# Patient Record
Sex: Male | Born: 1960 | Race: White | Hispanic: No | Marital: Married | Smoking: Never smoker
Health system: Southern US, Community
[De-identification: ages and names within clinical notes are randomized; demographics above are authoritative.]

## PROBLEM LIST (undated history)

## (undated) DIAGNOSIS — K219 Gastro-esophageal reflux disease without esophagitis: Secondary | ICD-10-CM

## (undated) DIAGNOSIS — F419 Anxiety disorder, unspecified: Secondary | ICD-10-CM

## (undated) HISTORY — PX: CHOLECYSTECTOMY: SHX55

---

## 1998-09-07 ENCOUNTER — Encounter: Payer: Self-pay | Admitting: Family Medicine

## 1998-09-07 ENCOUNTER — Ambulatory Visit (HOSPITAL_COMMUNITY): Admission: RE | Admit: 1998-09-07 | Discharge: 1998-09-07 | Payer: Self-pay | Admitting: Family Medicine

## 1998-10-04 ENCOUNTER — Inpatient Hospital Stay (HOSPITAL_COMMUNITY): Admission: AD | Admit: 1998-10-04 | Discharge: 1998-10-10 | Payer: Self-pay | Admitting: General Surgery

## 1998-10-04 ENCOUNTER — Encounter: Payer: Self-pay | Admitting: General Surgery

## 2005-10-04 ENCOUNTER — Ambulatory Visit (HOSPITAL_COMMUNITY): Admission: RE | Admit: 2005-10-04 | Discharge: 2005-10-04 | Payer: Self-pay | Admitting: Family Medicine

## 2007-01-19 IMAGING — DX DG ORTHOPANTOGRAM /PANORAMIC
1 series · 1 of 1 positions shown · non-contrast
Comparison: None.

CLINICAL DATA: Jaw pain for several weeks.
 ORTHOPANTOGRAM ? 1 VIEW:

[view not recorded]
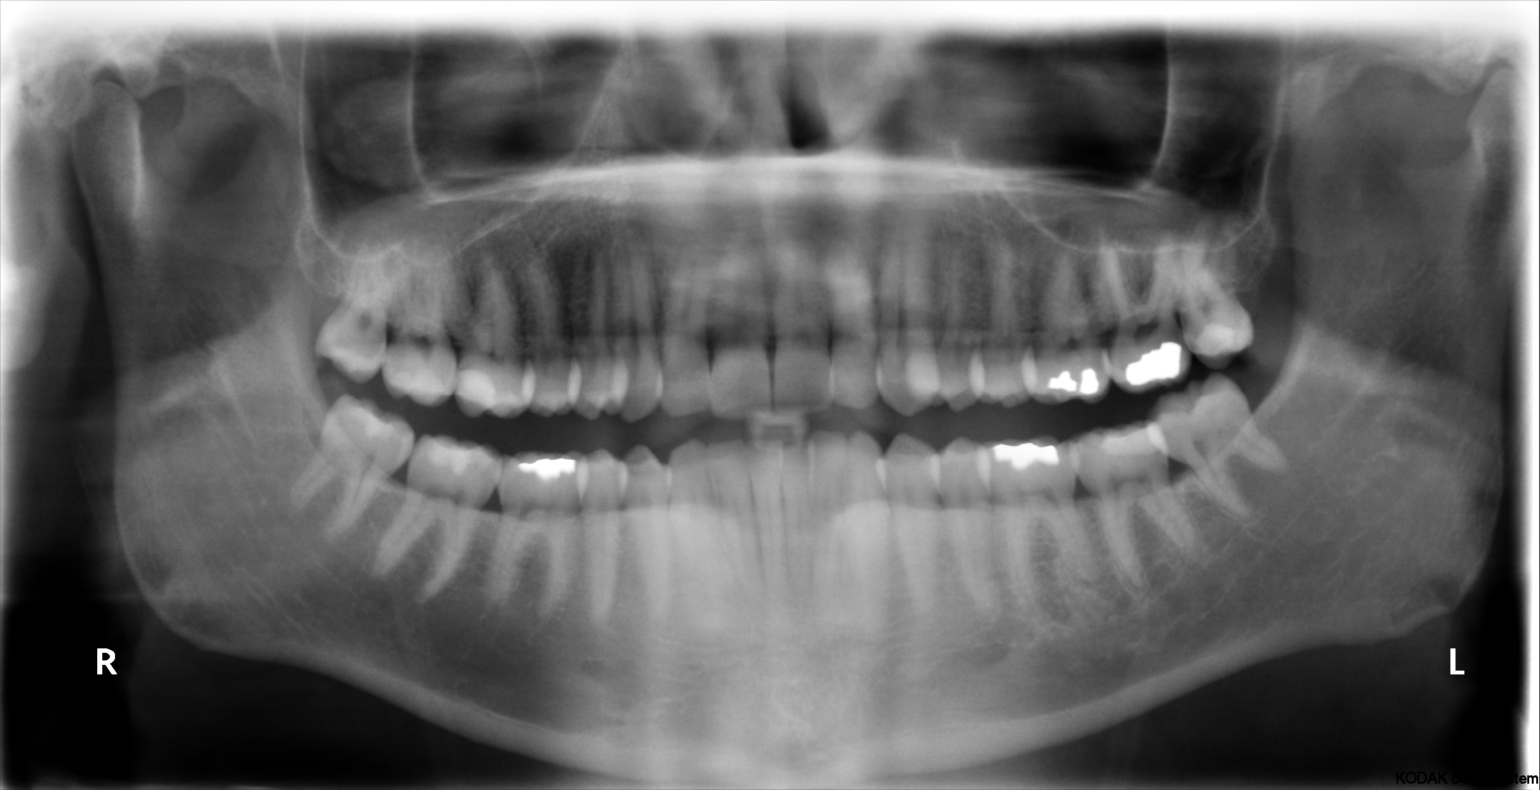

[1 of 1 positions shown; findings below may reference images not displayed]

FINDINGS: No evidence of periapical abscess.  Mandibular condyles are located.
IMPRESSION: No evidence of periapical abscess.

## 2021-09-19 ENCOUNTER — Inpatient Hospital Stay (HOSPITAL_COMMUNITY)
Admission: EM | Admit: 2021-09-19 | Discharge: 2021-09-26 | DRG: 086 | Disposition: A | Discharge status: Home / Self Care | Payer: Managed Care, Other (non HMO) | Attending: Neurosurgery | Admitting: Neurosurgery

## 2021-09-19 ENCOUNTER — Emergency Department (HOSPITAL_COMMUNITY): Payer: Managed Care, Other (non HMO)

## 2021-09-19 ENCOUNTER — Other Ambulatory Visit: Payer: Self-pay

## 2021-09-19 ENCOUNTER — Encounter (HOSPITAL_COMMUNITY): Payer: Self-pay

## 2021-09-19 DIAGNOSIS — T1490XA Injury, unspecified, initial encounter: Principal | ICD-10-CM

## 2021-09-19 DIAGNOSIS — S069XAA Unspecified intracranial injury with loss of consciousness status unknown, initial encounter: Secondary | ICD-10-CM | POA: Diagnosis present

## 2021-09-19 DIAGNOSIS — S0211GA Other fracture of occiput, right side, initial encounter for closed fracture: Secondary | ICD-10-CM | POA: Diagnosis present

## 2021-09-19 DIAGNOSIS — F419 Anxiety disorder, unspecified: Secondary | ICD-10-CM | POA: Diagnosis present

## 2021-09-19 DIAGNOSIS — R40241 Glasgow coma scale score 13-15, unspecified time: Secondary | ICD-10-CM | POA: Diagnosis present

## 2021-09-19 DIAGNOSIS — S32048A Other fracture of fourth lumbar vertebra, initial encounter for closed fracture: Secondary | ICD-10-CM | POA: Diagnosis present

## 2021-09-19 DIAGNOSIS — Y92018 Other place in single-family (private) house as the place of occurrence of the external cause: Secondary | ICD-10-CM

## 2021-09-19 DIAGNOSIS — S066X1A Traumatic subarachnoid hemorrhage with loss of consciousness of 30 minutes or less, initial encounter: Principal | ICD-10-CM | POA: Diagnosis present

## 2021-09-19 DIAGNOSIS — M25511 Pain in right shoulder: Secondary | ICD-10-CM | POA: Diagnosis present

## 2021-09-19 DIAGNOSIS — Z881 Allergy status to other antibiotic agents status: Secondary | ICD-10-CM | POA: Diagnosis not present

## 2021-09-19 DIAGNOSIS — S32018A Other fracture of first lumbar vertebra, initial encounter for closed fracture: Secondary | ICD-10-CM | POA: Diagnosis present

## 2021-09-19 DIAGNOSIS — K219 Gastro-esophageal reflux disease without esophagitis: Secondary | ICD-10-CM | POA: Diagnosis present

## 2021-09-19 DIAGNOSIS — W1789XA Other fall from one level to another, initial encounter: Secondary | ICD-10-CM | POA: Diagnosis present

## 2021-09-19 DIAGNOSIS — S40811A Abrasion of right upper arm, initial encounter: Secondary | ICD-10-CM | POA: Diagnosis present

## 2021-09-19 DIAGNOSIS — S80811A Abrasion, right lower leg, initial encounter: Secondary | ICD-10-CM | POA: Diagnosis present

## 2021-09-19 DIAGNOSIS — S32050A Wedge compression fracture of fifth lumbar vertebra, initial encounter for closed fracture: Secondary | ICD-10-CM | POA: Diagnosis present

## 2021-09-19 DIAGNOSIS — Y9389 Activity, other specified: Secondary | ICD-10-CM

## 2021-09-19 DIAGNOSIS — S42131A Displaced fracture of coracoid process, right shoulder, initial encounter for closed fracture: Secondary | ICD-10-CM | POA: Diagnosis present

## 2021-09-19 DIAGNOSIS — S0101XA Laceration without foreign body of scalp, initial encounter: Secondary | ICD-10-CM | POA: Diagnosis present

## 2021-09-19 HISTORY — DX: Anxiety disorder, unspecified: F41.9

## 2021-09-19 HISTORY — DX: Gastro-esophageal reflux disease without esophagitis: K21.9

## 2021-09-19 LAB — URINALYSIS, ROUTINE W REFLEX MICROSCOPIC
Bilirubin Urine: NEGATIVE
Glucose, UA: NEGATIVE mg/dL
Ketones, ur: NEGATIVE mg/dL
Leukocytes,Ua: NEGATIVE
Nitrite: NEGATIVE
Protein, ur: NEGATIVE mg/dL
Specific Gravity, Urine: 1.029 (ref 1.005–1.030)
pH: 7 (ref 5.0–8.0)

## 2021-09-19 LAB — I-STAT CHEM 8, ED
BUN: 18 mg/dL (ref 8–23)
Calcium, Ion: 1.17 mmol/L (ref 1.15–1.40)
Chloride: 105 mmol/L (ref 98–111)
Creatinine, Ser: 1.2 mg/dL (ref 0.61–1.24)
Glucose, Bld: 106 mg/dL — ABNORMAL HIGH (ref 70–99)
HCT: 36 % — ABNORMAL LOW (ref 39.0–52.0)
Hemoglobin: 12.2 g/dL — ABNORMAL LOW (ref 13.0–17.0)
Potassium: 4.1 mmol/L (ref 3.5–5.1)
Sodium: 141 mmol/L (ref 135–145)
TCO2: 25 mmol/L (ref 22–32)

## 2021-09-19 LAB — COMPREHENSIVE METABOLIC PANEL
ALT: 45 U/L — ABNORMAL HIGH (ref 0–44)
AST: 80 U/L — ABNORMAL HIGH (ref 15–41)
Albumin: 4.1 g/dL (ref 3.5–5.0)
Alkaline Phosphatase: 61 U/L (ref 38–126)
Anion gap: 12 (ref 5–15)
BUN: 14 mg/dL (ref 8–23)
CO2: 20 mmol/L — ABNORMAL LOW (ref 22–32)
Calcium: 9.2 mg/dL (ref 8.9–10.3)
Chloride: 108 mmol/L (ref 98–111)
Creatinine, Ser: 1.09 mg/dL (ref 0.61–1.24)
GFR, Estimated: >60 mL/min (ref >60)
Glucose, Bld: 102 mg/dL — ABNORMAL HIGH (ref 70–99)
Potassium: 4.4 mmol/L (ref 3.5–5.1)
Sodium: 140 mmol/L (ref 135–145)
Total Bilirubin: 3.9 mg/dL — ABNORMAL HIGH (ref 0.3–1.2)
Total Protein: 6.4 g/dL — ABNORMAL LOW (ref 6.5–8.1)

## 2021-09-19 LAB — CBC
HCT: 38.1 % — ABNORMAL LOW (ref 39.0–52.0)
Hemoglobin: 14.2 g/dL (ref 13.0–17.0)
MCH: 35.1 pg — ABNORMAL HIGH (ref 26.0–34.0)
MCHC: 37.3 g/dL — ABNORMAL HIGH (ref 30.0–36.0)
MCV: 94.3 fL (ref 80.0–100.0)
Platelets: 189 10*3/uL (ref 150–400)
RBC: 4.04 MIL/uL — ABNORMAL LOW (ref 4.22–5.81)
RDW: 14.3 % (ref 11.5–15.5)
WBC: 27 10*3/uL — ABNORMAL HIGH (ref 4.0–10.5)
nRBC: 0 % (ref 0.0–0.2)

## 2021-09-19 LAB — PROTIME-INR
INR: 1.1 (ref 0.8–1.2)
Prothrombin Time: 14.5 seconds (ref 11.4–15.2)

## 2021-09-19 LAB — ETHANOL: Alcohol, Ethyl (B): <10 mg/dL (ref <10)

## 2021-09-19 LAB — SAMPLE TO BLOOD BANK

## 2021-09-19 LAB — LACTIC ACID, PLASMA: Lactic Acid, Venous: 2.6 mmol/L (ref 0.5–1.9)

## 2021-09-19 MED ORDER — METOCLOPRAMIDE HCL 5 MG/ML IJ SOLN
5.0000 mg | Freq: Once | INTRAMUSCULAR | Status: AC
  Administered 2021-09-19: 5 mg via INTRAVENOUS
  Filled 2021-09-19: qty 2

## 2021-09-19 MED ORDER — IOHEXOL 300 MG/ML  SOLN
100.0000 mL | Freq: Once | INTRAMUSCULAR | Status: AC | PRN
Start: 2021-09-19 — End: 2021-09-19
  Administered 2021-09-19: 100 mL via INTRAVENOUS

## 2021-09-19 MED ORDER — FENTANYL CITRATE PF 50 MCG/ML IJ SOSY
50.0000 ug | PREFILLED_SYRINGE | Freq: Once | INTRAMUSCULAR | Status: AC
  Administered 2021-09-19: 50 ug via INTRAVENOUS
  Filled 2021-09-19: qty 1

## 2021-09-19 MED ORDER — DOCUSATE SODIUM 100 MG PO CAPS
100.0000 mg | ORAL_CAPSULE | Freq: Two times a day (BID) | ORAL | Status: DC
  Administered 2021-09-20 – 2021-09-26 (×12): 100 mg via ORAL
  Filled 2021-09-19 (×12): qty 1

## 2021-09-19 MED ORDER — ACETAMINOPHEN 325 MG PO TABS
650.0000 mg | ORAL_TABLET | Freq: Four times a day (QID) | ORAL | Status: DC | PRN
  Administered 2021-09-20 – 2021-09-21 (×2): 650 mg via ORAL
  Filled 2021-09-19 (×2): qty 2

## 2021-09-19 MED ORDER — CHLORHEXIDINE GLUCONATE CLOTH 2 % EX PADS
6.0000 | MEDICATED_PAD | Freq: Every day | CUTANEOUS | Status: DC
  Administered 2021-09-22 – 2021-09-26 (×5): 6 via TOPICAL

## 2021-09-19 MED ORDER — ONDANSETRON HCL 4 MG/2ML IJ SOLN
4.0000 mg | Freq: Once | INTRAMUSCULAR | Status: AC
  Administered 2021-09-19: 4 mg via INTRAVENOUS

## 2021-09-19 MED ORDER — ONDANSETRON HCL 4 MG/2ML IJ SOLN
4.0000 mg | Freq: Four times a day (QID) | INTRAMUSCULAR | Status: DC | PRN
  Administered 2021-09-20: 4 mg via INTRAVENOUS
  Filled 2021-09-19: qty 2

## 2021-09-19 MED ORDER — ONDANSETRON HCL 4 MG PO TABS: 4.0000 mg | ORAL_TABLET | Freq: Four times a day (QID) | ORAL | Status: DC | PRN

## 2021-09-19 MED ORDER — SODIUM CHLORIDE 0.9 % IV SOLN: INTRAVENOUS | Status: DC

## 2021-09-19 MED ORDER — HYDROMORPHONE HCL 1 MG/ML IJ SOLN
0.5000 mg | INTRAMUSCULAR | Status: DC | PRN
  Administered 2021-09-20: 0.5 mg via INTRAVENOUS
  Administered 2021-09-20: 1 mg via INTRAVENOUS
  Administered 2021-09-20 – 2021-09-21 (×2): 0.5 mg via INTRAVENOUS
  Administered 2021-09-25: 1 mg via INTRAVENOUS
  Filled 2021-09-19 (×6): qty 1

## 2021-09-19 MED ORDER — FENTANYL CITRATE PF 50 MCG/ML IJ SOSY: 50.0000 ug | PREFILLED_SYRINGE | Freq: Once | INTRAMUSCULAR | Status: AC

## 2021-09-19 MED ORDER — METHOCARBAMOL 1000 MG/10ML IJ SOLN
500.0000 mg | Freq: Four times a day (QID) | INTRAVENOUS | Status: DC | PRN
  Administered 2021-09-20 (×2): 500 mg via INTRAVENOUS
  Filled 2021-09-19: qty 5
  Filled 2021-09-19: qty 500
  Filled 2021-09-19: qty 5
  Filled 2021-09-19: qty 500

## 2021-09-19 MED ORDER — POLYETHYLENE GLYCOL 3350 17 G PO PACK: 17.0000 g | PACK | Freq: Every day | ORAL | Status: DC | PRN

## 2021-09-19 MED ORDER — HYDROCODONE-ACETAMINOPHEN 5-325 MG PO TABS
1.0000 | ORAL_TABLET | ORAL | Status: DC | PRN
  Administered 2021-09-19 – 2021-09-26 (×24): 2 via ORAL
  Filled 2021-09-19 (×27): qty 2

## 2021-09-19 MED ORDER — ACETAMINOPHEN 650 MG RE SUPP: 650.0000 mg | Freq: Four times a day (QID) | RECTAL | Status: DC | PRN

## 2021-09-19 MED ORDER — FENTANYL CITRATE PF 50 MCG/ML IJ SOSY
PREFILLED_SYRINGE | INTRAMUSCULAR | Status: AC
  Administered 2021-09-19: 50 ug via INTRAVENOUS
  Filled 2021-09-19: qty 1

## 2021-09-19 MED ORDER — HYDRALAZINE HCL 20 MG/ML IJ SOLN: 10.0000 mg | INTRAMUSCULAR | Status: DC | PRN

## 2021-09-19 NOTE — H&P (Signed)
Keith Schroeder is an 61 y.o. male.   Chief Complaint: Multitrauma HPI: 61 year old male who suffered a fall through a ceiling striking the floor.  Patient with probable brief loss of consciousness.  He awakened upon evaluation by EMS.  Patient complains of headache and dizziness.  He also complains of back pain and right shoulder pain.  He has been hemodynamically stable throughout.  He has no hypoxia.  He has no shortness of breath.  He has had no seizure activity.  He is remained oriented and appropriate throughout his evaluation.  He is on no anticoagulation.  He denies neck pain.  He has no radiating pain numbness or weakness.  Past Medical History:  Diagnosis Date   Anxiety    GERD (gastroesophageal reflux disease)     Past Surgical History:  Procedure Laterality Date   CHOLECYSTECTOMY      History reviewed. No pertinent family history. Social History:  reports that he has never smoked. He has never used smokeless tobacco. He reports current alcohol use. He reports that he does not use drugs.  Allergies:  Allergies  Allergen Reactions   Clindamycin Hcl Rash    (Not in a hospital admission)   Results for orders placed or performed during the hospital encounter of 09/19/21 (from the past 48 hour(s))  Comprehensive metabolic panel     Status: Abnormal   Collection Time: 09/19/21  7:04 PM  Result Value Ref Range   Sodium 140 135 - 145 mmol/L   Potassium 4.4 3.5 - 5.1 mmol/L    Comment: SLIGHT HEMOLYSIS   Chloride 108 98 - 111 mmol/L   CO2 20 (L) 22 - 32 mmol/L   Glucose, Bld 102 (H) 70 - 99 mg/dL    Comment: Glucose reference range applies only to samples taken after fasting for at least 8 hours.   BUN 14 8 - 23 mg/dL   Creatinine, Ser 9.79 0.61 - 1.24 mg/dL   Calcium 9.2 8.9 - 89.2 mg/dL   Total Protein 6.4 (L) 6.5 - 8.1 g/dL   Albumin 4.1 3.5 - 5.0 g/dL   AST 80 (H) 15 - 41 U/L    Comment: SLIGHT HEMOLYSIS   ALT 45 (H) 0 - 44 U/L    Comment: SLIGHT HEMOLYSIS    Alkaline Phosphatase 61 38 - 126 U/L    Comment: SLIGHT HEMOLYSIS   Total Bilirubin 3.9 (H) 0.3 - 1.2 mg/dL    Comment: SLIGHT HEMOLYSIS   GFR, Estimated >60 >60 mL/min    Comment: (NOTE) Calculated using the CKD-EPI Creatinine Equation (2021)    Anion gap 12 5 - 15    Comment: Performed at [facility], [address], [place], [place] [address]  Ethanol     Status: None   Collection Time: 09/19/21  7:04 PM  Result Value Ref Range   Alcohol, Ethyl (B) <10 <10 mg/dL    Comment: (NOTE) Lowest detectable limit for serum alcohol is 10 mg/dL.  For medical purposes only. Performed at [facility], [address] [place], [place] [address]   Lactic acid, plasma     Status: Abnormal   Collection Time: 09/19/21  7:04 PM  Result Value Ref Range   Lactic Acid, Venous 2.6 (HH) 0.5 - 1.9 mmol/L    Comment: CRITICAL RESULT CALLED TO, READ BACK BY AND VERIFIED WITH K.MUNNETT,RN @2020  09/19/2021 VANG.J Performed at [facility], [address], [place], [place]   Protime-INR     Status:  None   Collection Time: 09/19/21  7:04 PM  Result Value Ref Range   Prothrombin Time 14.5 11.4 - 15.2 seconds   INR 1.1 0.8 - 1.2    Comment: (NOTE) INR goal varies based on device and disease states. Performed at [facility], [address], [place], [place] [address]   Sample to Blood Bank     Status: None   Collection Time: 09/19/21  7:04 PM  Result Value Ref Range   Blood Bank Specimen SAMPLE AVAILABLE FOR TESTING    Sample Expiration      09/20/2021,2359 Performed at [facility], [address], [place], [place] [address]   I-Stat Chem 8, ED     Status: Abnormal   Collection Time: 09/19/21  7:27 PM  Result Value Ref Range   Sodium 141 135 - 145 mmol/L   Potassium 4.1 3.5 - 5.1 mmol/L   Chloride 105 98 - 111 mmol/L   BUN 18 8 - 23 mg/dL   Creatinine, Ser 2.53 0.61 - 1.24 mg/dL   Glucose, Bld 664 (H) 70 - 99 mg/dL    Comment: Glucose reference range  applies only to samples taken after fasting for at least 8 hours.   Calcium, Ion 1.17 1.15 - 1.40 mmol/L   TCO2 25 22 - 32 mmol/L   Hemoglobin 12.2 (L) 13.0 - 17.0 g/dL   HCT 40.3 (L) 47.4 - 25.9 %  Urinalysis, Routine w reflex microscopic Urine, Clean Catch     Status: Abnormal   Collection Time: 09/19/21  8:51 PM  Result Value Ref Range   Color, Urine YELLOW YELLOW   APPearance CLEAR CLEAR   Specific Gravity, Urine 1.029 1.005 - 1.030   pH 7.0 5.0 - 8.0   Glucose, UA NEGATIVE NEGATIVE mg/dL   Hgb urine dipstick SMALL (A) NEGATIVE   Bilirubin Urine NEGATIVE NEGATIVE   Ketones, ur NEGATIVE NEGATIVE mg/dL   Protein, ur NEGATIVE NEGATIVE mg/dL   Nitrite NEGATIVE NEGATIVE   Leukocytes,Ua NEGATIVE NEGATIVE   RBC / HPF 0-5 0 - 5 RBC/hpf   WBC, UA 0-5 0 - 5 WBC/hpf   Bacteria, UA RARE (A) NONE SEEN   Sperm, UA PRESENT     Comment: Performed at [facility], [address], [place], [place] [address]  CBC     Status: Abnormal   Collection Time: 09/19/21  9:00 PM  Result Value Ref Range   WBC 27.0 (H) 4.0 - 10.5 K/uL   RBC 4.04 (L) 4.22 - 5.81 MIL/uL   Hemoglobin 14.2 13.0 - 17.0 g/dL   HCT 56.4 (L) 33.2 - 95.1 %   MCV 94.3 80.0 - 100.0 fL   MCH 35.1 (H) 26.0 - 34.0 pg   MCHC 37.3 (H) 30.0 - 36.0 g/dL   RDW 88.4 16.6 - 06.3 %   Platelets 189 150 - 400 K/uL   nRBC 0.0 0.0 - 0.2 %    Comment: Performed at [facility], [address], [place], [place] [address]   CT CERVICAL SPINE WO CONTRAST  Result Date: 09/19/2021 CLINICAL DATA:  Fall through ceiling from attic EXAM: CT CERVICAL, THORACIC, AND LUMBAR SPINE WITHOUT CONTRAST TECHNIQUE: Multidetector CT imaging of the cervical, thoracic and lumbar spine was performed without intravenous contrast. Multiplanar CT image reconstructions were also generated. RADIATION DOSE REDUCTION: This exam was performed according to the departmental dose-optimization program which includes automated exposure control, adjustment of the mA  and/or kV according to patient size and/or  use of iterative reconstruction technique. COMPARISON:  None Available. FINDINGS: CT CERVICAL SPINE FINDINGS Alignment: Physiologic.  No listhesis. Skull base and vertebrae: Redemonstrated right parieto-occipital fracture, which extends to the foramen magnum, better seen on the same-day CT head. No acute vertebral body fracture. No primary bone lesion or focal pathologic process. Soft tissues and spinal canal: No prevertebral fluid or swelling. No visible canal hematoma. Disc levels: Mild degenerative changes without high-grade spinal canal stenosis or neural foraminal narrowing. Upper chest: Please see same-day CT chest. Other: None. CT THORACIC SPINE FINDINGS Alignment: No listhesis. Vertebrae: No acute fracture or focal pathologic process. Paraspinal and other soft tissues: Please see same-day CT chest abdomen pelvis. Disc levels: No high-grade spinal canal stenosis or neural foraminal narrowing. CT LUMBAR SPINE FINDINGS Segmentation: 5 lumbar type vertebrae. Alignment: No listhesis. Vertebrae: Compression fracture of the anterior superior aspect of L5 with approximately 10% associated vertebral body height loss, favored to be acute. No retropulsion. Minimally displaced right L1 transverse process fracture (series 4, image 31). Possible nondisplaced fracture of the right L4 transverse process (series 4, image 82), although this may be artifactual or vascular. Paraspinal and other soft tissues: Please see same-day CT chest abdomen pelvis. Disc levels: No high-grade spinal canal stenosis. IMPRESSION: 1. Likely acute compression fracture at the anterior superior aspect of L5 with approximately 10% vertebral body height loss and no retropulsion. 2. Minimally displaced right L1 transverse process fracture with additional possible nondisplaced fracture of the right L4 transverse process. 3. No acute fracture or traumatic listhesis in the cervical or thoracic spine. 4. Please  see same-day CT chest abdomen pelvis for soft tissue findings. These results were called by telephone at the time of interpretation on 09/19/2021 at 9:22 pm to provider [facility] , who verbally acknowledged these results. Electronically Signed   By: Wiliam Ke M.D.   On: 09/19/2021 21:23   CT L-SPINE NO CHARGE  Result Date: 09/19/2021 CLINICAL DATA:  Fall through ceiling from attic EXAM: CT CERVICAL, THORACIC, AND LUMBAR SPINE WITHOUT CONTRAST TECHNIQUE: Multidetector CT imaging of the cervical, thoracic and lumbar spine was performed without intravenous contrast. Multiplanar CT image reconstructions were also generated. RADIATION DOSE REDUCTION: This exam was performed according to the departmental dose-optimization program which includes automated exposure control, adjustment of the mA and/or kV according to patient size and/or use of iterative reconstruction technique. COMPARISON:  None Available. FINDINGS: CT CERVICAL SPINE FINDINGS Alignment: Physiologic.  No listhesis. Skull base and vertebrae: Redemonstrated right parieto-occipital fracture, which extends to the foramen magnum, better seen on the same-day CT head. No acute vertebral body fracture. No primary bone lesion or focal pathologic process. Soft tissues and spinal canal: No prevertebral fluid or swelling. No visible canal hematoma. Disc levels: Mild degenerative changes without high-grade spinal canal stenosis or neural foraminal narrowing. Upper chest: Please see same-day CT chest. Other: None. CT THORACIC SPINE FINDINGS Alignment: No listhesis. Vertebrae: No acute fracture or focal pathologic process. Paraspinal and other soft tissues: Please see same-day CT chest abdomen pelvis. Disc levels: No high-grade spinal canal stenosis or neural foraminal narrowing. CT LUMBAR SPINE FINDINGS Segmentation: 5 lumbar type vertebrae. Alignment: No listhesis. Vertebrae: Compression fracture of the anterior superior aspect of L5 with approximately 10%  associated vertebral body height loss, favored to be acute. No retropulsion. Minimally displaced right L1 transverse process fracture (series 4, image 31). Possible nondisplaced fracture of the right L4 transverse process (series 4, image 82), although this may be artifactual or vascular. Paraspinal and other soft  tissues: Please see same-day CT chest abdomen pelvis. Disc levels: No high-grade spinal canal stenosis. IMPRESSION: 1. Likely acute compression fracture at the anterior superior aspect of L5 with approximately 10% vertebral body height loss and no retropulsion. 2. Minimally displaced right L1 transverse process fracture with additional possible nondisplaced fracture of the right L4 transverse process. 3. No acute fracture or traumatic listhesis in the cervical or thoracic spine. 4. Please see same-day CT chest abdomen pelvis for soft tissue findings. These results were called by telephone at the time of interpretation on 09/19/2021 at 9:22 pm to provider [facility] , who verbally acknowledged these results. Electronically Signed   By: Wiliam Ke M.D.   On: 09/19/2021 21:23   CT T-SPINE NO CHARGE  Result Date: 09/19/2021 CLINICAL DATA:  Fall through ceiling from attic EXAM: CT CERVICAL, THORACIC, AND LUMBAR SPINE WITHOUT CONTRAST TECHNIQUE: Multidetector CT imaging of the cervical, thoracic and lumbar spine was performed without intravenous contrast. Multiplanar CT image reconstructions were also generated. RADIATION DOSE REDUCTION: This exam was performed according to the departmental dose-optimization program which includes automated exposure control, adjustment of the mA and/or kV according to patient size and/or use of iterative reconstruction technique. COMPARISON:  None Available. FINDINGS: CT CERVICAL SPINE FINDINGS Alignment: Physiologic.  No listhesis. Skull base and vertebrae: Redemonstrated right parieto-occipital fracture, which extends to the foramen magnum, better seen on the same-day CT  head. No acute vertebral body fracture. No primary bone lesion or focal pathologic process. Soft tissues and spinal canal: No prevertebral fluid or swelling. No visible canal hematoma. Disc levels: Mild degenerative changes without high-grade spinal canal stenosis or neural foraminal narrowing. Upper chest: Please see same-day CT chest. Other: None. CT THORACIC SPINE FINDINGS Alignment: No listhesis. Vertebrae: No acute fracture or focal pathologic process. Paraspinal and other soft tissues: Please see same-day CT chest abdomen pelvis. Disc levels: No high-grade spinal canal stenosis or neural foraminal narrowing. CT LUMBAR SPINE FINDINGS Segmentation: 5 lumbar type vertebrae. Alignment: No listhesis. Vertebrae: Compression fracture of the anterior superior aspect of L5 with approximately 10% associated vertebral body height loss, favored to be acute. No retropulsion. Minimally displaced right L1 transverse process fracture (series 4, image 31). Possible nondisplaced fracture of the right L4 transverse process (series 4, image 82), although this may be artifactual or vascular. Paraspinal and other soft tissues: Please see same-day CT chest abdomen pelvis. Disc levels: No high-grade spinal canal stenosis. IMPRESSION: 1. Likely acute compression fracture at the anterior superior aspect of L5 with approximately 10% vertebral body height loss and no retropulsion. 2. Minimally displaced right L1 transverse process fracture with additional possible nondisplaced fracture of the right L4 transverse process. 3. No acute fracture or traumatic listhesis in the cervical or thoracic spine. 4. Please see same-day CT chest abdomen pelvis for soft tissue findings. These results were called by telephone at the time of interpretation on 09/19/2021 at 9:22 pm to provider [facility] , who verbally acknowledged these results. Electronically Signed   By: Wiliam Ke M.D.   On: 09/19/2021 21:23   CT CHEST ABDOMEN PELVIS W  CONTRAST  Result Date: 09/19/2021 CLINICAL DATA:  Polytrauma, blunt [id number] Fall from 10 feet through rafters of house EXAM: CT CHEST, ABDOMEN, AND PELVIS WITH CONTRAST TECHNIQUE: Multidetector CT imaging of the chest, abdomen and pelvis was performed following the standard protocol during bolus administration of intravenous contrast. RADIATION DOSE REDUCTION: This exam was performed according to the departmental dose-optimization program which includes automated exposure control, adjustment of the mA  and/or kV according to patient size and/or use of iterative reconstruction technique. CONTRAST:  OMNIPAQUE IOHEXOL 300 MG/ML  SOLN COMPARISON:  None Available. FINDINGS: CHEST: Cardiovascular: No aortic injury. The thoracic aorta is normal in caliber. Mild atherosclerotic plaque. The heart is normal in size. No significant pericardial effusion. The main pulmonary artery is normal in caliber no central or segmental pulmonary embolus. Mediastinum/Nodes: No pneumomediastinum. No mediastinal hematoma. The esophagus is unremarkable.  Small hiatal hernia. The thyroid is unremarkable. The central airways are patent. No mediastinal, hilar, or axillary lymphadenopathy. Lungs/Pleura: No focal consolidation. No pulmonary nodule. No pulmonary mass. No pulmonary contusion or laceration. No pneumatocele formation. No pleural effusion. No pneumothorax. No hemothorax. Musculoskeletal/Chest wall: No chest wall mass. Right shoulder subcutaneus soft tissue fat stranding. No acute rib or sternal fracture. Displaced right scapular fracture extends to the base of the coracoid process (3:7, 7:47). Please see separately dictated CT thoracolumbar spine 09/19/2021. ABDOMEN / PELVIS: Hepatobiliary: Not enlarged. There is a 1.2 cm fluid density lesion within the right hepatic lobe that likely represents a simple hepatic cyst. No laceration or subcapsular hematoma. Status post cholecystectomy.  No biliary ductal dilatation. Pancreas: Normal  pancreatic contour. No main pancreatic duct dilatation. Spleen: Not enlarged. No focal lesion. No laceration, subcapsular hematoma, or vascular injury. Adrenals/Urinary Tract: No nodularity bilaterally. Bilateral kidneys enhance symmetrically. No hydronephrosis. No contusion, laceration, or subcapsular hematoma. No injury to the vascular structures or collecting systems. No hydroureter. The urinary bladder is unremarkable. On delayed imaging, there is no urothelial wall thickening and there are no filling defects in the opacified portions of the bilateral collecting systems or ureters. Stomach/Bowel: Surgical changes related to a small bowel resection. No small or large bowel wall thickening or dilatation. Colonic diverticulosis. The appendix is unremarkable. Vasculature/Lymphatics: Mild atherosclerotic plaque. No abdominal aorta or iliac aneurysm. No active contrast extravasation or pseudoaneurysm. No abdominal, pelvic, inguinal lymphadenopathy. Reproductive: Normal. Other: No simple free fluid ascites. No pneumoperitoneum. No hemoperitoneum. No mesenteric hematoma identified. No organized fluid collection. Musculoskeletal: No significant soft tissue hematoma. No acute pelvic fracture. Please see separately dictated CT thoracolumbar spine 09/19/2021. Ports and Devices: None. IMPRESSION: 1. Displaced right scapular fracture extends to the base of the coracoid process. 2. No acute intrathoracic, intra-abdominal, intrapelvic acute traumatic abnormality. 3. Please see separately dictated CT thoracolumbar spine 09/19/2021. 4.  Aortic Atherosclerosis (ICD10-I70.0). Electronically Signed   By: Tish Frederickson M.D.   On: 09/19/2021 20:53   DG Shoulder Right  Result Date: 09/19/2021 CLINICAL DATA:  10 foot fall, right shoulder pain EXAM: RIGHT SHOULDER - 2+ VIEW COMPARISON:  None available FINDINGS: There is no evidence of fracture or dislocation. There is no evidence of arthropathy or other focal bone abnormality. Soft  tissues are unremarkable. IMPRESSION: Negative. Electronically Signed   By: Minerva Fester M.D.   On: 09/19/2021 20:35   CT HEAD WO CONTRAST  Result Date: 09/19/2021 CLINICAL DATA:  Head trauma, fell through the rafters house EXAM: CT HEAD WITHOUT CONTRAST TECHNIQUE: Contiguous axial images were obtained from the base of the skull through the vertex without intravenous contrast. RADIATION DOSE REDUCTION: This exam was performed according to the departmental dose-optimization program which includes automated exposure control, adjustment of the mA and/or kV according to patient size and/or use of iterative reconstruction technique. COMPARISON:  None Available. FINDINGS: Brain: Parenchymal and likely subarachnoid hemorrhage in the inferior frontal lobes bilaterally (series 3, image 17 and series 5, image 21), measuring approximately 2.4 x 3.7 x 1.7 cm. No  significant surrounding edema or mass effect at this time. Small subdural hemorrhages along the bilateral posterior parietal lobes, measuring up to 5 mm on the right (series 6, image 24) and 5 mm on the left (series 6, image 37). No significant mass effect or midline shift. No evidence of acute infarct, mass, or hydrocephalus. Vascular: No hyperdense vessel. Skull: Nondisplaced right occipital and parietal calvarial fracture (series 4, image 26). Small hematoma overlying the right parietal scalp. Sinuses/Orbits: Minimal mucosal thickening in the maxillary sinuses. The orbits are unremarkable. Other: The mastoid air cells are well aerated. IMPRESSION: 1. Parenchymal and subarachnoid hemorrhage in the inferior frontal lobes bilaterally. 2. Small subdural hematomas along the bilateral posterior parietal lobes, measuring up to 5 mm. No significant mass effect or midline shift. 3. Nondisplaced right parietal and occipital calvarial fracture. These results were called by telephone at the time of interpretation on 09/19/2021 at 8:31 pm to provider [facility] , who  verbally acknowledged these results. Electronically Signed   By: Wiliam KeAlison  Vasan M.D.   On: 09/19/2021 20:33   DG Pelvis Portable  Result Date: 09/19/2021 CLINICAL DATA:  Trauma.  Patient fall 2310ft; trauma; pelvis area pain EXAM: PORTABLE PELVIS 1-2 VIEWS COMPARISON:  None Available. FINDINGS: There is no evidence of pelvic fracture or diastasis. Frontal view of the bilateral hips unremarkable. No pelvic bone lesions are seen. IMPRESSION: Negative for acute traumatic injury. Electronically Signed   By: Tish FredericksonMorgane  Naveau M.D.   On: 09/19/2021 19:33   DG Chest Port 1 View  Result Date: 09/19/2021 CLINICAL DATA:  Trauma. EXAM: PORTABLE CHEST 1 VIEW COMPARISON:  None Available. FINDINGS: The heart size and mediastinal contours are within normal limits. Both lungs are clear. The visualized skeletal structures are unremarkable. IMPRESSION: No active disease. Electronically Signed   By: Darliss CheneyAmy  Guttmann M.D.   On: 09/19/2021 19:26    Pertinent items noted in HPI and remainder of comprehensive ROS otherwise negative.  Blood pressure 125/71, pulse 91, temperature (!) 96.8 F (36 C), temperature source Temporal, resp. rate (!) 22, height 5' 10.5" (1.791 m), weight 79.4 kg, SpO2 93 %.  Patient is awake and alert.  He has photophobia.  His speech is fluent.  His judgment and insight appear intact.  Cranial nerve function finds his pupils to be equal round reactive to light at 4 mm.  His gaze is conjugate.  He has no obvious nystagmus.  Facial movement and facial sensation normal bilateral.  Tongue protrudes to the midline.  Motor examination 5/5 bilateral upper extremities without drift.  Lower extremity motor function 5/5 with normal tone.  Reflexes normal.  Examination of his head reveals a small superficial laceration in his occipital region.  He has no obvious cranial abnormality.  Oropharynx nasopharynx and external auditory canals are all clear.  Neck is supple.  C-spine is nontender.  No evidence of bony  abnormality.  Pulses normal.  Chest nontender.  Chest cage intact.  Breath sounds equal bilaterally.  Heart normal.  Abdomen soft.  Nontender.  Extremities free from injury or deformity although patient does have significant right shoulder tenderness.  Patient does have moderate lumbar tenderness and spasm. Assessment/Plan Status post multitrauma secondary to fall.  Patient with a nondisplaced occipital fracture.  He has a small amount of subfrontal contusion without mass effect.  There is some associated traumatic subarachnoid hemorrhage.  Ventricles are normal.  No evidence of significant edema.  Basilar cisterns well-preserved.  Cervical spine clear.  Lumbar spine with a mild superior compression  fracture of L5 without a significant retropulsion or canal compromise.  Patient also with a L1 transverse process fracture.  Patient also with a right scapular fracture which is to be evaluated by orthopedics.  Plan for admission to ICU for observation.  Plan follow-up head CT scan in morning.  Patient may have clear liquids overnight.  Patient will be mobilized with a lumbar corset in the morning provided that his head CT scan is stable.  Sherilyn Cooter A Grady Lucci 09/19/2021, 10:26 PM

## 2021-09-19 NOTE — Progress Notes (Signed)
   09/19/21 2052  Clinical Encounter Type  Visited With Patient not available  Visit Type Trauma;Initial  Referral From Nurse  Consult/Referral To Chaplain   Chaplain Tery Sanfilippo responded to the page. The patient is being attended to by the medical team. There is no support person present. Chaplain remains available for followup support as needed. This note was prepared by Deneen Harts, M.Div..  For questions please contact by phone [phone].

## 2021-09-19 NOTE — ED Notes (Signed)
Patient states that he feels like he is going to vomit.  States that the room is spinning.  Patient rolled to side and vomited food particles.

## 2021-09-19 NOTE — ED Notes (Signed)
Patient transported to CT by Trauma RN 

## 2021-09-19 NOTE — ED Notes (Signed)
Pt oriented, but w/ repetitive statements

## 2021-09-19 NOTE — ED Provider Notes (Signed)
[facility] EMERGENCY DEPARTMENT Provider Note   CSN: [id number] Arrival date & time: 09/19/21  1857     History  Chief Complaint  Patient presents with   Level 2 Fall     10 ft GCS 14    Keith Schroeder is a 61 y.o. male unknown PMH who is BIBEMS as level 2 trauma after fall 10 feet.  History obtained from EMS as well as by patient.  They report that patient was up in his attic, with a repair man, when he excellently stepped off the beam and fell through the insulation and floor.  EMS estimates the patient fell about 10 feet.  Patient landed on his back, striking the back of his head.  There is LOC.  Not on thinners.  Since then, patient has mostly been complaining of pain in his low back.  Patient has scalp laceration to the back of his head per EMS.  Vitals have been stable in route without intervention.  Home Medications Prior to Admission medications   Not on File      Allergies    Patient has no allergy information on record.    Review of Systems   Review of Systems  Physical Exam Updated Vital Signs BP 119/70   Temp (!) 96.8 F (36 C) (Temporal)   Ht 5' 10.5" (1.791 m)   Wt 79.4 kg   BMI 24.75 kg/m  Physical Exam Vitals and nursing note reviewed.  Constitutional:      General: He is not in acute distress.    Appearance: Normal appearance. He is well-developed.  HENT:     Head: Normocephalic.     Comments: 5 cm scalp laceration to posterior upper skull.    Right Ear: External ear normal.     Left Ear: External ear normal.     Nose: Nose normal.     Mouth/Throat:     Mouth: Mucous membranes are moist.     Pharynx: Oropharynx is clear.  Eyes:     Pupils: Pupils are equal, round, and reactive to light.  Neck:     Comments: Cervical collar in place.  No tenderness to palpation over the midline cervical, thoracic, lumbar spine.  No external trauma seen to back or torso. Cardiovascular:     Rate and Rhythm: Normal rate and regular rhythm.      Heart sounds: No murmur heard. Pulmonary:     Effort: Pulmonary effort is normal. No respiratory distress.     Breath sounds: Normal breath sounds. No wheezing.  Abdominal:     Palpations: Abdomen is soft.     Tenderness: There is abdominal tenderness.     Comments: Tenderness to palpation over right upper quadrant of abdomen.  Musculoskeletal:        General: Signs of injury present. No swelling. Normal range of motion.     Cervical back: Neck supple.     Comments: Superficial abrasion over the medial right upper arm and right anterior shin.  Full range of motion of all extremities.  Skin:    General: Skin is warm and dry.  Neurological:     Mental Status: He is alert.     ED Results / Procedures / Treatments   Labs (all labs ordered are listed, but only abnormal results are displayed) Labs Reviewed  COMPREHENSIVE METABOLIC PANEL  CBC  ETHANOL  URINALYSIS, ROUTINE W REFLEX MICROSCOPIC  LACTIC ACID, PLASMA  PROTIME-INR  I-STAT CHEM 8, ED  SAMPLE TO BLOOD BANK  EKG None  Radiology No results found.  Procedures Ultrasound ED FAST  Date/Time: 09/19/2021 7:14 PM  Performed by: Skeet Simmer, MD Authorized by: Blane Ohara, MD  Procedure details:    Indications: blunt abdominal trauma and blunt chest trauma       Assess for:  Intra-abdominal fluid    Technique:  Abdominal    Images: not archived      Abdominal findings:    L kidney:  Visualized   R kidney:  Visualized   Liver:  Visualized    Bladder:  Visualized   Hepatorenal space visualized: identified     Splenorenal space: identified     Rectovesical free fluid: not identified     Splenorenal free fluid: not identified     Hepatorenal space free fluid: not identified   Comments:     FAST exam negative for free fluid in abdomen.     Medications Ordered in ED Medications - No data to display  ED Course/ Medical Decision Making/ A&P Clinical Course as of 09/20/21 0020  Mon Sep 19, 2021  2122  Compression fx L5 wo retropulsion Minimally displaced R L1 and L4 TP [BR]    Clinical Course User Index [BR] Skeet Simmer, MD                           Medical Decision Making Amount and/or Complexity of Data Reviewed Labs: ordered. Radiology: ordered.  Risk Prescription drug management. Decision regarding hospitalization.    Medical Decision Making:  Keith Schroeder is a 61 y.o. male not on blood thinners who presented to the ED today with a level 2 trauma.     Trauma Surgery team not at bedside upon patient arrival. Reviewed and confirmed nursing documentation for past medical history, family history, social history.   Initial Assessment:  Primary survey: Airway intact. BL breath sounds present.  Circulation established with WNL BP, large bore IVs, and radial/femoral pulses.  Disability evaluation negative. No obvious disability requiring intervention.  Patient fully exposed and all injuries were noted, any penetrating injuries were labeled with radiopaque markers. No emergent interventions took place in the primary survey.  Patient stable for CXR that demonstrated no traumatic hemopneumothorax and PXR that demonstrated no unstable pelvic fractures. EFAST negative.  Secondary survey: Patient fully exposed and secondary survey was performed.  See concurrent documentation for further information. In summary: patient had scalp laceration, hemostatic abrasions to the right arm and right leg Patient stable for transfer to CT scanner for further traumatic evaluation.   Trauma scans obtained, resulted notable for IPH, SAH, SDH, skull fracture., lumbar spine fractures, right scapular fracture.   Patient nauseous and given reglan and zofran. For pain control, provided IV fentanyl and PO norco.  Final Assessment and Plan:  Patient will be admitted to neurosurgery service with orthopedic consult.  Clinical Impression:  1. Trauma     Final Clinical Impression(s) / ED  Diagnoses Final diagnoses:  Trauma    Rx / DC Orders ED Discharge Orders     None         Skeet Simmer, MD 09/20/21 Park Liter, MD 09/20/21 1513

## 2021-09-20 ENCOUNTER — Inpatient Hospital Stay (HOSPITAL_COMMUNITY): Payer: Managed Care, Other (non HMO)

## 2021-09-20 LAB — CBC
HCT: 36.4 % — ABNORMAL LOW (ref 39.0–52.0)
Hemoglobin: 13.6 g/dL (ref 13.0–17.0)
MCH: 35.2 pg — ABNORMAL HIGH (ref 26.0–34.0)
MCHC: 37.4 g/dL — ABNORMAL HIGH (ref 30.0–36.0)
MCV: 94.3 fL (ref 80.0–100.0)
Platelets: 197 10*3/uL (ref 150–400)
RBC: 3.86 MIL/uL — ABNORMAL LOW (ref 4.22–5.81)
RDW: 14.3 % (ref 11.5–15.5)
WBC: 19.9 10*3/uL — ABNORMAL HIGH (ref 4.0–10.5)
nRBC: 0 % (ref 0.0–0.2)

## 2021-09-20 LAB — HIV ANTIBODY (ROUTINE TESTING W REFLEX): HIV Screen 4th Generation wRfx: NONREACTIVE

## 2021-09-20 LAB — BASIC METABOLIC PANEL
Anion gap: 6 (ref 5–15)
BUN: 12 mg/dL (ref 8–23)
CO2: 28 mmol/L (ref 22–32)
Calcium: 9 mg/dL (ref 8.9–10.3)
Chloride: 105 mmol/L (ref 98–111)
Creatinine, Ser: 1.13 mg/dL (ref 0.61–1.24)
GFR, Estimated: >60 mL/min (ref >60)
Glucose, Bld: 153 mg/dL — ABNORMAL HIGH (ref 70–99)
Potassium: 4.7 mmol/L (ref 3.5–5.1)
Sodium: 139 mmol/L (ref 135–145)

## 2021-09-20 LAB — MRSA NEXT GEN BY PCR, NASAL: MRSA by PCR Next Gen: NOT DETECTED

## 2021-09-20 MED ORDER — CITALOPRAM HYDROBROMIDE 20 MG PO TABS
20.0000 mg | ORAL_TABLET | Freq: Every day | ORAL | Status: DC
  Administered 2021-09-20 – 2021-09-26 (×7): 20 mg via ORAL
  Filled 2021-09-20 (×4): qty 1
  Filled 2021-09-20: qty 2
  Filled 2021-09-20: qty 1
  Filled 2021-09-20: qty 2

## 2021-09-20 MED ORDER — PANTOPRAZOLE SODIUM 40 MG PO TBEC
40.0000 mg | DELAYED_RELEASE_TABLET | Freq: Every day | ORAL | Status: DC
  Administered 2021-09-20 – 2021-09-26 (×7): 40 mg via ORAL
  Filled 2021-09-20 (×7): qty 1

## 2021-09-20 NOTE — Progress Notes (Signed)
   09/20/21 1325  Clinical Encounter Type  Visited With Patient and family together  Visit Type Initial;Spiritual support  Referral From Chaplain  Consult/Referral To Chaplain   Chaplain visited the patient at the request of over night chaplain. The patient, Joriel, was alert and feeling ok all things considered. He shared that he was aware of him slipping but nothing past that. He is grateful that he can move and use his arms and legs and he is taking it one day at a time. Braylon had family visiting with him, his wife commented that there are a lot of people praying for him. I wished them a restful day as I departed.   Valerie Roys  [facility]  [phone]

## 2021-09-20 NOTE — Progress Notes (Signed)
Overall stable.  No new issues or problems.  Patient with moderate headache.  His dizziness is mostly improved.  He reports moderate back pain.  He is having no radiating pain numbness or weakness.  He is afebrile.  His vital signs are stable.  Urine output is good.  He is awake and alert.  He is oriented and reasonably appropriate.  Speech is fluent.  Motor and sensory function are intact.  Follow-up head CT scan demonstrates an evolution of his bilateral subfrontal contusions.  No evidence of any other complicating features.  Patient is status post significant fall with resultant bifrontal contusions.  Continue ICU observation.  Slowly begin to mobilize.

## 2021-09-20 NOTE — Progress Notes (Signed)
Orthopedic Tech Progress Note Patient Details:  Keith Schroeder 09/19/1960 [id number]  Ortho Devices Type of Ortho Device: Lumbar corsett Ortho Device/Splint Location: BACK Ortho Device/Splint Interventions: Ordered   Post Interventions Patient Tolerated: Well Instructions Provided: Care of device  Donald Pore 09/20/2021, 8:22 AM

## 2021-09-20 NOTE — Consult Note (Signed)
Reason for Consult:Right scapula fx Referring Physician: Altamease Oiler Time called: 1610 Time at bedside: 0924   Keith Schroeder is an 61 y.o. male.  HPI: Keith Schroeder was in his attic when he slipped and fell through the ceiling to the floor below. He does not remember landing. He was brought to the ED where workup showed a right scapula fx in addition to other injuries and orthopedic surgery was consulted. He is RHD and retired.  Past Medical History:  Diagnosis Date   Anxiety    GERD (gastroesophageal reflux disease)     Past Surgical History:  Procedure Laterality Date   CHOLECYSTECTOMY      History reviewed. No pertinent family history.  Social History:  reports that he has never smoked. He has never used smokeless tobacco. He reports current alcohol use. He reports that he does not use drugs.  Allergies:  Allergies  Allergen Reactions   Clindamycin Hcl Rash    Medications: I have reviewed the patient's current medications.  Results for orders placed or performed during the hospital encounter of 09/19/21 (from the past 48 hour(s))  Comprehensive metabolic panel     Status: Abnormal   Collection Time: 09/19/21  7:04 PM  Result Value Ref Range   Sodium 140 135 - 145 mmol/L   Potassium 4.4 3.5 - 5.1 mmol/L    Comment: SLIGHT HEMOLYSIS   Chloride 108 98 - 111 mmol/L   CO2 20 (L) 22 - 32 mmol/L   Glucose, Bld 102 (H) 70 - 99 mg/dL    Comment: Glucose reference range applies only to samples taken after fasting for at least 8 hours.   BUN 14 8 - 23 mg/dL   Creatinine, Ser 9.60 0.61 - 1.24 mg/dL   Calcium 9.2 8.9 - 45.4 mg/dL   Total Protein 6.4 (L) 6.5 - 8.1 g/dL   Albumin 4.1 3.5 - 5.0 g/dL   AST 80 (H) 15 - 41 U/L    Comment: SLIGHT HEMOLYSIS   ALT 45 (H) 0 - 44 U/L    Comment: SLIGHT HEMOLYSIS   Alkaline Phosphatase 61 38 - 126 U/L    Comment: SLIGHT HEMOLYSIS   Total Bilirubin 3.9 (H) 0.3 - 1.2 mg/dL    Comment: SLIGHT HEMOLYSIS   GFR, Estimated >60 >60 mL/min    Comment:  (NOTE) Calculated using the CKD-EPI Creatinine Equation (2021)    Anion gap 12 5 - 15    Comment: Performed at [facility], [address] [place], [place] [address]  Ethanol     Status: None   Collection Time: 09/19/21  7:04 PM  Result Value Ref Range   Alcohol, Ethyl (B) <10 <10 mg/dL    Comment: (NOTE) Lowest detectable limit for serum alcohol is 10 mg/dL.  For medical purposes only. Performed at [facility], [address], [place], [place] [address]   Lactic acid, plasma     Status: Abnormal   Collection Time: 09/19/21  7:04 PM  Result Value Ref Range   Lactic Acid, Venous 2.6 (HH) 0.5 - 1.9 mmol/L    Comment: CRITICAL RESULT CALLED TO, READ BACK BY AND VERIFIED WITH K.MUNNETT,RN  09/19/2021 [email or web address].J Performed at [facility], [address], [place], [place] [address]   Protime-INR     Status: None   Collection Time: 09/19/21  7:04 PM  Result Value Ref Range   Prothrombin Time 14.5 11.4 - 15.2 seconds   INR 1.1 0.8 - 1.2    Comment: (  NOTE) INR goal varies based on device and disease states. Performed at [facility], [address], [place], [place] [address]   Sample to Blood Bank     Status: None   Collection Time: 09/19/21  7:04 PM  Result Value Ref Range   Blood Bank Specimen SAMPLE AVAILABLE FOR TESTING    Sample Expiration      09/20/2021,2359 Performed at [facility], [address] [place], [place] [address]   I-Stat Chem 8, ED     Status: Abnormal   Collection Time: 09/19/21  7:27 PM  Result Value Ref Range   Sodium 141 135 - 145 mmol/L   Potassium 4.1 3.5 - 5.1 mmol/L   Chloride 105 98 - 111 mmol/L   BUN 18 8 - 23 mg/dL   Creatinine, Ser 0.98 0.61 - 1.24 mg/dL   Glucose, Bld 119 (H) 70 - 99 mg/dL    Comment: Glucose reference range applies only to samples taken after fasting for at least 8 hours.   Calcium, Ion 1.17 1.15 - 1.40 mmol/L   TCO2 25 22 - 32 mmol/L   Hemoglobin 12.2 (L) 13.0 - 17.0 g/dL   HCT 14.7  (L) 82.9 - 52.0 %  Urinalysis, Routine w reflex microscopic Urine, Clean Catch     Status: Abnormal   Collection Time: 09/19/21  8:51 PM  Result Value Ref Range   Color, Urine YELLOW YELLOW   APPearance CLEAR CLEAR   Specific Gravity, Urine 1.029 1.005 - 1.030   pH 7.0 5.0 - 8.0   Glucose, UA NEGATIVE NEGATIVE mg/dL   Hgb urine dipstick SMALL (A) NEGATIVE   Bilirubin Urine NEGATIVE NEGATIVE   Ketones, ur NEGATIVE NEGATIVE mg/dL   Protein, ur NEGATIVE NEGATIVE mg/dL   Nitrite NEGATIVE NEGATIVE   Leukocytes,Ua NEGATIVE NEGATIVE   RBC / HPF 0-5 0 - 5 RBC/hpf   WBC, UA 0-5 0 - 5 WBC/hpf   Bacteria, UA RARE (A) NONE SEEN   Sperm, UA PRESENT     Comment: Performed at [facility], [address] [place], [place] [address]  CBC     Status: Abnormal   Collection Time: 09/19/21  9:00 PM  Result Value Ref Range   WBC 27.0 (H) 4.0 - 10.5 K/uL   RBC 4.04 (L) 4.22 - 5.81 MIL/uL   Hemoglobin 14.2 13.0 - 17.0 g/dL   HCT 08.6 (L) 57.8 - 46.9 %   MCV 94.3 80.0 - 100.0 fL   MCH 35.1 (H) 26.0 - 34.0 pg   MCHC 37.3 (H) 30.0 - 36.0 g/dL   RDW 62.9 52.8 - 41.3 %   Platelets 189 150 - 400 K/uL   nRBC 0.0 0.0 - 0.2 %    Comment: Performed at [facility], [address], [place], [place] [address]  MRSA Next Gen by PCR, Nasal     Status: None   Collection Time: 09/19/21 11:40 PM   Specimen: Nasal Mucosa; Nasal Swab  Result Value Ref Range   MRSA by PCR Next Gen NOT DETECTED NOT DETECTED    Comment: (NOTE) The GeneXpert MRSA Assay (FDA approved for NASAL specimens only), is one component of a comprehensive MRSA colonization surveillance program. It is not intended to diagnose MRSA infection nor to guide or monitor treatment for MRSA infections. Test performance is not FDA approved in patients less than 70 years old. Performed at [facility], [address] [place], [place] [address]   Basic metabolic  panel     Status: Abnormal   Collection Time: 09/20/21  2:47 AM  Result  Value Ref Range   Sodium 139 135 - 145 mmol/L   Potassium 4.7 3.5 - 5.1 mmol/L   Chloride 105 98 - 111 mmol/L   CO2 28 22 - 32 mmol/L   Glucose, Bld 153 (H) 70 - 99 mg/dL    Comment: Glucose reference range applies only to samples taken after fasting for at least 8 hours.   BUN 12 8 - 23 mg/dL   Creatinine, Ser 9.50 0.61 - 1.24 mg/dL   Calcium 9.0 8.9 - 93.2 mg/dL   GFR, Estimated >67 >12 mL/min    Comment: (NOTE) Calculated using the CKD-EPI Creatinine Equation (2021)    Anion gap 6 5 - 15    Comment: Performed at [facility], [address] [place], [place] [address]  CBC     Status: Abnormal   Collection Time: 09/20/21  2:47 AM  Result Value Ref Range   WBC 19.9 (H) 4.0 - 10.5 K/uL   RBC 3.86 (L) 4.22 - 5.81 MIL/uL   Hemoglobin 13.6 13.0 - 17.0 g/dL   HCT 98.3 (L) 38.2 - 50.5 %   MCV 94.3 80.0 - 100.0 fL   MCH 35.2 (H) 26.0 - 34.0 pg   MCHC 37.4 (H) 30.0 - 36.0 g/dL   RDW 39.7 67.3 - 41.9 %   Platelets 197 150 - 400 K/uL   nRBC 0.0 0.0 - 0.2 %    Comment: Performed at [facility], [address], [place], [place] [address]    CT HEAD WO CONTRAST  Result Date: 09/20/2021 CLINICAL DATA:  61 year old male status post fall through ceiling from attic. Intracranial hemorrhage. Skull fracture. EXAM: CT HEAD WITHOUT CONTRAST TECHNIQUE: Contiguous axial images were obtained from the base of the skull through the vertex without intravenous contrast. RADIATION DOSE REDUCTION: This exam was performed according to the departmental dose-optimization program which includes automated exposure control, adjustment of the mA and/or kV according to patient size and/or use of iterative reconstruction technique. COMPARISON:  Head CT 09/19/2021. FINDINGS: Brain: Anterior inferior frontal gyrus hemorrhagic contusions appear mildly progressed on series 3, image 15. Mild regional edema. No significant mass effect. Small volume bilateral subarachnoid hemorrhage, including along the sylvian  fissure (series 3, image 20), also the interpeduncular cistern. No intraventricular hemorrhage or ventriculomegaly. Only minimal left side subdural hematoma is evident now along the posterior left superior frontal gyrus series 5, image 42. No right side subdural blood identified now. Basilar cisterns remain patent. No midline shift. No cortically based acute infarct identified. Vascular: Mild Calcified atherosclerosis at the skull base. Skull: Linear nondisplaced skull fracture from the right posterolateral foramen magnum tracks cephalad toward the confluence of the lambdoid sutures. No other skull fracture identified. Sinuses/Orbits: Visualized paranasal sinuses and mastoids are stable and well aerated. Other: Broad-based right posterior and superior convexity scalp hematoma. Visualized orbit soft tissues are within normal limits. IMPRESSION: 1. Multifocal traumatic intracranial hemorrhage: - mild progression of anterior bifrontal hemorrhagic contusions. No associated mass effect. - small volume bilateral subarachnoid hemorrhage not significantly changed. - only minimal left side subdural hematoma visible now by CT. 2. No significant intracranial mass effect. No IVH or ventriculomegaly. 3. Nondisplaced right side skull fracture tracking from the foramen magnum toward the vertex. Right scalp hematoma. 4. No new intracranial abnormality. Electronically Signed   By: Odessa Fleming M.D.   On: 09/20/2021 09:20   CT CERVICAL  SPINE WO CONTRAST  Result Date: 09/19/2021 CLINICAL DATA:  Fall through ceiling from attic EXAM: CT CERVICAL, THORACIC, AND LUMBAR SPINE WITHOUT CONTRAST TECHNIQUE: Multidetector CT imaging of the cervical, thoracic and lumbar spine was performed without intravenous contrast. Multiplanar CT image reconstructions were also generated. RADIATION DOSE REDUCTION: This exam was performed according to the departmental dose-optimization program which includes automated exposure control, adjustment of the mA  and/or kV according to patient size and/or use of iterative reconstruction technique. COMPARISON:  None Available. FINDINGS: CT CERVICAL SPINE FINDINGS Alignment: Physiologic.  No listhesis. Skull base and vertebrae: Redemonstrated right parieto-occipital fracture, which extends to the foramen magnum, better seen on the same-day CT head. No acute vertebral body fracture. No primary bone lesion or focal pathologic process. Soft tissues and spinal canal: No prevertebral fluid or swelling. No visible canal hematoma. Disc levels: Mild degenerative changes without high-grade spinal canal stenosis or neural foraminal narrowing. Upper chest: Please see same-day CT chest. Other: None. CT THORACIC SPINE FINDINGS Alignment: No listhesis. Vertebrae: No acute fracture or focal pathologic process. Paraspinal and other soft tissues: Please see same-day CT chest abdomen pelvis. Disc levels: No high-grade spinal canal stenosis or neural foraminal narrowing. CT LUMBAR SPINE FINDINGS Segmentation: 5 lumbar type vertebrae. Alignment: No listhesis. Vertebrae: Compression fracture of the anterior superior aspect of L5 with approximately 10% associated vertebral body height loss, favored to be acute. No retropulsion. Minimally displaced right L1 transverse process fracture (series 4, image 31). Possible nondisplaced fracture of the right L4 transverse process (series 4, image 82), although this may be artifactual or vascular. Paraspinal and other soft tissues: Please see same-day CT chest abdomen pelvis. Disc levels: No high-grade spinal canal stenosis. IMPRESSION: 1. Likely acute compression fracture at the anterior superior aspect of L5 with approximately 10% vertebral body height loss and no retropulsion. 2. Minimally displaced right L1 transverse process fracture with additional possible nondisplaced fracture of the right L4 transverse process. 3. No acute fracture or traumatic listhesis in the cervical or thoracic spine. 4. Please  see same-day CT chest abdomen pelvis for soft tissue findings. These results were called by telephone at the time of interpretation on 09/19/2021 at 9:22 pm to provider [facility] , who verbally acknowledged these results. Electronically Signed   By: Wiliam Ke M.D.   On: 09/19/2021 21:23   CT L-SPINE NO CHARGE  Result Date: 09/19/2021 CLINICAL DATA:  Fall through ceiling from attic EXAM: CT CERVICAL, THORACIC, AND LUMBAR SPINE WITHOUT CONTRAST TECHNIQUE: Multidetector CT imaging of the cervical, thoracic and lumbar spine was performed without intravenous contrast. Multiplanar CT image reconstructions were also generated. RADIATION DOSE REDUCTION: This exam was performed according to the departmental dose-optimization program which includes automated exposure control, adjustment of the mA and/or kV according to patient size and/or use of iterative reconstruction technique. COMPARISON:  None Available. FINDINGS: CT CERVICAL SPINE FINDINGS Alignment: Physiologic.  No listhesis. Skull base and vertebrae: Redemonstrated right parieto-occipital fracture, which extends to the foramen magnum, better seen on the same-day CT head. No acute vertebral body fracture. No primary bone lesion or focal pathologic process. Soft tissues and spinal canal: No prevertebral fluid or swelling. No visible canal hematoma. Disc levels: Mild degenerative changes without high-grade spinal canal stenosis or neural foraminal narrowing. Upper chest: Please see same-day CT chest. Other: None. CT THORACIC SPINE FINDINGS Alignment: No listhesis. Vertebrae: No acute fracture or focal pathologic process. Paraspinal and other soft tissues: Please see same-day CT chest abdomen pelvis. Disc levels: No high-grade spinal canal  stenosis or neural foraminal narrowing. CT LUMBAR SPINE FINDINGS Segmentation: 5 lumbar type vertebrae. Alignment: No listhesis. Vertebrae: Compression fracture of the anterior superior aspect of L5 with approximately 10%  associated vertebral body height loss, favored to be acute. No retropulsion. Minimally displaced right L1 transverse process fracture (series 4, image 31). Possible nondisplaced fracture of the right L4 transverse process (series 4, image 82), although this may be artifactual or vascular. Paraspinal and other soft tissues: Please see same-day CT chest abdomen pelvis. Disc levels: No high-grade spinal canal stenosis. IMPRESSION: 1. Likely acute compression fracture at the anterior superior aspect of L5 with approximately 10% vertebral body height loss and no retropulsion. 2. Minimally displaced right L1 transverse process fracture with additional possible nondisplaced fracture of the right L4 transverse process. 3. No acute fracture or traumatic listhesis in the cervical or thoracic spine. 4. Please see same-day CT chest abdomen pelvis for soft tissue findings. These results were called by telephone at the time of interpretation on 09/19/2021 at 9:22 pm to [facility] , who verbally acknowledged these results. Electronically Signed   By: Wiliam KeAlison  Vasan M.D.   On: 09/19/2021 21:23   CT T-SPINE NO CHARGE  Result Date: 09/19/2021 CLINICAL DATA:  Fall through ceiling from attic EXAM: CT CERVICAL, THORACIC, AND LUMBAR SPINE WITHOUT CONTRAST TECHNIQUE: Multidetector CT imaging of the cervical, thoracic and lumbar spine was performed without intravenous contrast. Multiplanar CT image reconstructions were also generated. RADIATION DOSE REDUCTION: This exam was performed according to the departmental dose-optimization program which includes automated exposure control, adjustment of the mA and/or kV according to patient size and/or use of iterative reconstruction technique. COMPARISON:  None Available. FINDINGS: CT CERVICAL SPINE FINDINGS Alignment: Physiologic.  No listhesis. Skull base and vertebrae: Redemonstrated right parieto-occipital fracture, which extends to the foramen magnum, better seen on the same-day CT  head. No acute vertebral body fracture. No primary bone lesion or focal pathologic process. Soft tissues and spinal canal: No prevertebral fluid or swelling. No visible canal hematoma. Disc levels: Mild degenerative changes without high-grade spinal canal stenosis or neural foraminal narrowing. Upper chest: Please see same-day CT chest. Other: None. CT THORACIC SPINE FINDINGS Alignment: No listhesis. Vertebrae: No acute fracture or focal pathologic process. Paraspinal and other soft tissues: Please see same-day CT chest abdomen pelvis. Disc levels: No high-grade spinal canal stenosis or neural foraminal narrowing. CT LUMBAR SPINE FINDINGS Segmentation: 5 lumbar type vertebrae. Alignment: No listhesis. Vertebrae: Compression fracture of the anterior superior aspect of L5 with approximately 10% associated vertebral body height loss, favored to be acute. No retropulsion. Minimally displaced right L1 transverse process fracture (series 4, image 31). Possible nondisplaced fracture of the right L4 transverse process (series 4, image 82), although this may be artifactual or vascular. Paraspinal and other soft tissues: Please see same-day CT chest abdomen pelvis. Disc levels: No high-grade spinal canal stenosis. IMPRESSION: 1. Likely acute compression fracture at the anterior superior aspect of L5 with approximately 10% vertebral body height loss and no retropulsion. 2. Minimally displaced right L1 transverse process fracture with additional possible nondisplaced fracture of the right L4 transverse process. 3. No acute fracture or traumatic listhesis in the cervical or thoracic spine. 4. Please see same-day CT chest abdomen pelvis for soft tissue findings. These results were called by telephone at the time of interpretation on 09/19/2021 at 9:22 pm to [facility] , who verbally acknowledged these results. Electronically Signed   By: Wiliam KeAlison  Vasan M.D.   On: 09/19/2021 21:23  CT CHEST ABDOMEN PELVIS W  CONTRAST  Result Date: 09/19/2021 CLINICAL DATA:  Polytrauma, blunt [id number] Fall from 10 feet through rafters of house EXAM: CT CHEST, ABDOMEN, AND PELVIS WITH CONTRAST TECHNIQUE: Multidetector CT imaging of the chest, abdomen and pelvis was performed following the standard protocol during bolus administration of intravenous contrast. RADIATION DOSE REDUCTION: This exam was performed according to the departmental dose-optimization program which includes automated exposure control, adjustment of the mA and/or kV according to patient size and/or use of iterative reconstruction technique. CONTRAST:  OMNIPAQUE IOHEXOL 300 MG/ML  SOLN COMPARISON:  None Available. FINDINGS: CHEST: Cardiovascular: No aortic injury. The thoracic aorta is normal in caliber. Mild atherosclerotic plaque. The heart is normal in size. No significant pericardial effusion. The main pulmonary artery is normal in caliber no central or segmental pulmonary embolus. Mediastinum/Nodes: No pneumomediastinum. No mediastinal hematoma. The esophagus is unremarkable.  Small hiatal hernia. The thyroid is unremarkable. The central airways are patent. No mediastinal, hilar, or axillary lymphadenopathy. Lungs/Pleura: No focal consolidation. No pulmonary nodule. No pulmonary mass. No pulmonary contusion or laceration. No pneumatocele formation. No pleural effusion. No pneumothorax. No hemothorax. Musculoskeletal/Chest wall: No chest wall mass. Right shoulder subcutaneus soft tissue fat stranding. No acute rib or sternal fracture. Displaced right scapular fracture extends to the base of the coracoid process (3:7, 7:47). Please see separately dictated CT thoracolumbar spine 09/19/2021. ABDOMEN / PELVIS: Hepatobiliary: Not enlarged. There is a 1.2 cm fluid density lesion within the right hepatic lobe that likely represents a simple hepatic cyst. No laceration or subcapsular hematoma. Status post cholecystectomy.  No biliary ductal dilatation. Pancreas: Normal  pancreatic contour. No main pancreatic duct dilatation. Spleen: Not enlarged. No focal lesion. No laceration, subcapsular hematoma, or vascular injury. Adrenals/Urinary Tract: No nodularity bilaterally. Bilateral kidneys enhance symmetrically. No hydronephrosis. No contusion, laceration, or subcapsular hematoma. No injury to the vascular structures or collecting systems. No hydroureter. The urinary bladder is unremarkable. On delayed imaging, there is no urothelial wall thickening and there are no filling defects in the opacified portions of the bilateral collecting systems or ureters. Stomach/Bowel: Surgical changes related to a small bowel resection. No small or large bowel wall thickening or dilatation. Colonic diverticulosis. The appendix is unremarkable. Vasculature/Lymphatics: Mild atherosclerotic plaque. No abdominal aorta or iliac aneurysm. No active contrast extravasation or pseudoaneurysm. No abdominal, pelvic, inguinal lymphadenopathy. Reproductive: Normal. Other: No simple free fluid ascites. No pneumoperitoneum. No hemoperitoneum. No mesenteric hematoma identified. No organized fluid collection. Musculoskeletal: No significant soft tissue hematoma. No acute pelvic fracture. Please see separately dictated CT thoracolumbar spine 09/19/2021. Ports and Devices: None. IMPRESSION: 1. Displaced right scapular fracture extends to the base of the coracoid process. 2. No acute intrathoracic, intra-abdominal, intrapelvic acute traumatic abnormality. 3. Please see separately dictated CT thoracolumbar spine 09/19/2021. 4.  Aortic Atherosclerosis (ICD10-I70.0). Electronically Signed   By: Tish Frederickson M.D.   On: 09/19/2021 20:53   DG Shoulder Right  Result Date: 09/19/2021 CLINICAL DATA:  10 foot fall, right shoulder pain EXAM: RIGHT SHOULDER - 2+ VIEW COMPARISON:  None available FINDINGS: There is no evidence of fracture or dislocation. There is no evidence of arthropathy or other focal bone abnormality. Soft  tissues are unremarkable. IMPRESSION: Negative. Electronically Signed   By: Minerva Fester M.D.   On: 09/19/2021 20:35   CT HEAD WO CONTRAST  Result Date: 09/19/2021 CLINICAL DATA:  Head trauma, fell through the rafters house EXAM: CT HEAD WITHOUT CONTRAST TECHNIQUE: Contiguous axial images were obtained from the base of  the skull through the vertex without intravenous contrast. RADIATION DOSE REDUCTION: This exam was performed according to the departmental dose-optimization program which includes automated exposure control, adjustment of the mA and/or kV according to patient size and/or use of iterative reconstruction technique. COMPARISON:  None Available. FINDINGS: Brain: Parenchymal and likely subarachnoid hemorrhage in the inferior frontal lobes bilaterally (series 3, image 17 and series 5, image 21), measuring approximately 2.4 x 3.7 x 1.7 cm. No significant surrounding edema or mass effect at this time. Small subdural hemorrhages along the bilateral posterior parietal lobes, measuring up to 5 mm on the right (series 6, image 24) and 5 mm on the left (series 6, image 37). No significant mass effect or midline shift. No evidence of acute infarct, mass, or hydrocephalus. Vascular: No hyperdense vessel. Skull: Nondisplaced right occipital and parietal calvarial fracture (series 4, image 26). Small hematoma overlying the right parietal scalp. Sinuses/Orbits: Minimal mucosal thickening in the maxillary sinuses. The orbits are unremarkable. Other: The mastoid air cells are well aerated. IMPRESSION: 1. Parenchymal and subarachnoid hemorrhage in the inferior frontal lobes bilaterally. 2. Small subdural hematomas along the bilateral posterior parietal lobes, measuring up to 5 mm. No significant mass effect or midline shift. 3. Nondisplaced right parietal and occipital calvarial fracture. These results were called by telephone at the time of interpretation on 09/19/2021 at 8:31 pm to provider [facility] , who  verbally acknowledged these results. Electronically Signed   By: Wiliam Ke M.D.   On: 09/19/2021 20:33   DG Pelvis Portable  Result Date: 09/19/2021 CLINICAL DATA:  Trauma.  Patient fall 60ft; trauma; pelvis area pain EXAM: PORTABLE PELVIS 1-2 VIEWS COMPARISON:  None Available. FINDINGS: There is no evidence of pelvic fracture or diastasis. Frontal view of the bilateral hips unremarkable. No pelvic bone lesions are seen. IMPRESSION: Negative for acute traumatic injury. Electronically Signed   By: Tish Frederickson M.D.   On: 09/19/2021 19:33   DG Chest Port 1 View  Result Date: 09/19/2021 CLINICAL DATA:  Trauma. EXAM: PORTABLE CHEST 1 VIEW COMPARISON:  None Available. FINDINGS: The heart size and mediastinal contours are within normal limits. Both lungs are clear. The visualized skeletal structures are unremarkable. IMPRESSION: No active disease. Electronically Signed   By: Darliss Cheney M.D.   On: 09/19/2021 19:26    Review of Systems  HENT:  Negative for ear discharge, ear pain, hearing loss and tinnitus.   Eyes:  Negative for photophobia and pain.  Respiratory:  Negative for cough and shortness of breath.   Cardiovascular:  Negative for chest pain.  Gastrointestinal:  Negative for abdominal pain, nausea and vomiting.  Genitourinary:  Negative for dysuria, flank pain, frequency and urgency.  Musculoskeletal:  Positive for arthralgias (Right shoulder) and back pain. Negative for myalgias and neck pain.  Neurological:  Negative for dizziness and headaches.  Hematological:  Does not bruise/bleed easily.  Psychiatric/Behavioral:  The patient is not nervous/anxious.    Blood pressure 117/68, pulse 98, temperature 98.2 F (36.8 C), temperature source Axillary, resp. rate 13, height 5' 10.5" (1.791 m), weight 79.4 kg, SpO2 97 %. Physical Exam Constitutional:      General: He is not in acute distress.    Appearance: He is well-developed. He is not diaphoretic.  HENT:     Head: Normocephalic  and atraumatic.  Eyes:     General: No scleral icterus.       Right eye: No discharge.        Left eye: No discharge.  Conjunctiva/sclera: Conjunctivae normal.  Cardiovascular:     Rate and Rhythm: Normal rate and regular rhythm.  Pulmonary:     Effort: Pulmonary effort is normal. No respiratory distress.  Musculoskeletal:     Cervical back: Normal range of motion.     Comments: Right shoulder, elbow, wrist, digits- no skin wounds, mild TTP shoulder, no instability, no blocks to motion  Sens  Ax/R/M/U intact  Mot   Ax/ R/ PIN/ M/ AIN/ U intact  Rad 2+  Skin:    General: Skin is warm and dry.  Neurological:     Mental Status: He is alert.  Psychiatric:        Mood and Affect: Mood normal.        Behavior: Behavior normal.     Assessment/Plan: Right scapula fx -- Plan sling, NWB. F/u with Dr. Charlann Boxer in 1-2 weeks.    Freeman Caldron, PA-C Orthopedic Surgery [phone] 09/20/2021, 9:32 AM

## 2021-09-21 NOTE — TOC CAGE-AID Note (Signed)
Transition of Care [facility]) - CAGE-AID Screening   Patient Details  Name: Keith Schroeder MRN: [id number] Date of Birth: 26-Jul-1960  Transition of Care [facility]) CM/SW Contact:    Coralee Pesa, [place] Phone Number: 09/21/2021, 3:47 PM   Clinical Narrative: CSW met with pt at bedside to complete CAGE- AID assessment. Pt denies any substance use and declines resources at this time.   CAGE-AID Screening:    Have You Ever Felt You Ought to Cut Down on Your Drinking or Drug Use?: No Have People Annoyed You By Critizing Your Drinking Or Drug Use?: No Have You Felt Bad Or Guilty About Your Drinking Or Drug Use?: No Have You Ever Had a Drink or Used Drugs First Thing In The Morning to Steady Your Nerves or to Get Rid of a Hangover?: No CAGE-AID Score: 0  Substance Abuse Education Offered: No

## 2021-09-21 NOTE — Plan of Care (Signed)

## 2021-09-21 NOTE — Progress Notes (Signed)
Overall progressing reasonably well.  Still with some moderate headache.  Vertigo is much improved.  Has not mobilized but feels like his back pain is tolerable.  He is having no new numbness paresthesias or weakness.  He is afebrile.  His vital signs are stable.  His urine output is good.  Motor and sensory function normal bilaterally.  Cranial nerve function normal.  No evidence of other issue.  Overall progressing well.  Plan transfer to progressive.  Continue efforts at mobilization.

## 2021-09-21 NOTE — Evaluation (Addendum)
Occupational Therapy Evaluation Patient Details Name: Keith Schroeder MRN: [id number] DOB: 05-07-1960 Today's Date: 09/21/2021   History of Present Illness Keith Schroeder is a 61 y/o male admitted  8/14 after a fall from attic with brief LOC found to have R scapular fracture, L1 TVP fx, L5 compression fx, and bifrontal contusions. PMH of GERD, cholesystectomy.   Clinical Impression   PTA patient independent, driving and caring for 60 y/o granddaughter.  Patient admitted for above and presents with problem list below, including R shoulder, head and back pain, impaired balance, decreased activity tolerance, dizziness.  He was educated on R shoulder precautions and sling use, back precautions and brace management, ADL compensatory techniques and recommendations.  Completed elbow/wrist/hand exercises to R UE without pain.  He completes transfers with min assist but requires up to mod assist for ADLs, functional mobility in room with min guard (pt fearful of dizziness and encouraged focus gaze, reports no dizziness but headache in standing- resolved when sitting).  Based on performance today, believe he will benefit from continued OT services acutely to optimize independence, safety and return to pLOF. Will follow.      Recommendations for follow up therapy are one component of a multi-disciplinary discharge planning process, led by the attending physician.  Recommendations may be updated based on patient status, additional functional criteria and insurance authorization.   Follow Up Recommendations  No OT follow up    Assistance Recommended at Discharge Intermittent Supervision/Assistance  Patient can return home with the following A little help with walking and/or transfers;A lot of help with bathing/dressing/bathroom;Assistance with cooking/housework;Assist for transportation;Help with stairs or ramp for entrance    Functional Status Assessment  Patient has had a recent decline in their functional  status and demonstrates the ability to make significant improvements in function in a reasonable and predictable amount of time.  Equipment Recommendations  BSC/3in1    Recommendations for Other Services       Precautions / Restrictions Precautions Precautions: Fall;Back Precaution Booklet Issued: No Precaution Comments: per ortho- no shoulder ROM, OK elbow/wrist/hand Required Braces or Orthoses: Sling;Spinal Brace Spinal Brace: Lumbar corset;Applied in sitting position Restrictions Weight Bearing Restrictions: Yes RUE Weight Bearing: Non weight bearing      Mobility Bed Mobility               General bed mobility comments: OOB in recliner upon entry    Transfers Overall transfer level: Needs assistance Equipment used: None Transfers: Sit to/from Stand Sit to Stand: Min guard           General transfer comment: assist for balance offerred HHA once standing      Balance Overall balance assessment: Needs assistance   Sitting balance-Leahy Scale: Good     Standing balance support: No upper extremity supported, During functional activity Standing balance-Leahy Scale: Fair Standing balance comment: UE support for dynamic tasks                           ADL either performed or assessed with clinical judgement   ADL Overall ADL's : Needs assistance/impaired     Grooming: Minimal assistance;Sitting           Upper Body Dressing : Minimal assistance;Sitting   Lower Body Dressing: Moderate assistance;Sit to/from stand Lower Body Dressing Details (indicate cue type and reason): able to doff socks but requires assist to don, reviewed compesnatory techniques; min guard sit to stand Toilet Transfer: Min guard;Ambulation Toilet Transfer Details (  indicate cue type and reason): simulated in room         Functional mobility during ADLs: Min guard General ADL Comments: pt limited by headache in standing, fearful of feeling dizzy during mobility but  did not feel dizzy     Vision Baseline Vision/History: 1 Wears glasses Ability to See in Adequate Light: 0 Adequate Patient Visual Report: No change from baseline Vision Assessment?: No apparent visual deficits     Perception     Praxis      Pertinent Vitals/Pain Pain Assessment Pain Assessment: 0-10 Pain Score: 5  Pain Location: back, head Pain Descriptors / Indicators: Headache, Aching Pain Intervention(s): Limited activity within patient's tolerance, Monitored during session, Repositioned     Hand Dominance Right   Extremity/Trunk Assessment Upper Extremity Assessment Upper Extremity Assessment: RUE deficits/detail RUE Deficits / Details: per ortho- no shoulder ROM; WFL elbow and distal. in sling RUE Sensation: WNL RUE Coordination: decreased gross motor   Lower Extremity Assessment Lower Extremity Assessment: Defer to PT evaluation   Cervical / Trunk Assessment Cervical / Trunk Assessment: Other exceptions Cervical / Trunk Exceptions: back brace s/p fx   Communication Communication Communication: No difficulties   Cognition Arousal/Alertness: Awake/alert Behavior During Therapy: Flat affect Overall Cognitive Status: Within Functional Limits for tasks assessed                                 General Comments: appears WFL, pt demonstrates good awareness and recall during session.  Denies difficulty with recall and problem sovling.  Continue assessment     General Comments  VSS on RA, pt with headache in standing but denies dizziness today. Encouarged focused gaze    Exercises     Shoulder Instructions      Home Living Family/patient expects to be discharged to:: Private residence Living Arrangements: Spouse/significant other;Children (wife and son) Available Help at Discharge: Family Type of Home: House Home Access: Stairs to enter Secretary/administrator of Steps: 4 Entrance Stairs-Rails: None Home Layout: Two level Alternate Level  Stairs-Number of Steps: 14 Alternate Level Stairs-Rails: Right Bathroom Shower/Tub: Producer, television/film/video: Standard     Home Equipment: None   Additional Comments: wife works from home      Prior Functioning/Environment Prior Level of Function : Independent/Modified Independent             Mobility Comments: semi-retired after laid off in pandemic, watches grandaughter 6 y/o ADLs Comments: independent, driving        OT Problem List: Decreased activity tolerance;Impaired balance (sitting and/or standing);Decreased safety awareness;Decreased knowledge of use of DME or AE;Decreased knowledge of precautions;Impaired UE functional use;Pain      OT Treatment/Interventions: Self-care/ADL training;Therapeutic exercise;DME and/or AE instruction;Therapeutic activities;Patient/family education;Balance training    OT Goals(Current goals can be found in the care plan section) Acute Rehab OT Goals Patient Stated Goal: get better OT Goal Formulation: With patient Time For Goal Achievement: 10/05/21 Potential to Achieve Goals: Good  OT Frequency: Min 3X/week    Co-evaluation              AM-PAC OT "6 Clicks" Daily Activity     Outcome Measure Help from another person eating meals?: A Little Help from another person taking care of personal grooming?: A Little Help from another person toileting, which includes using toliet, bedpan, or urinal?: A Lot Help from another person bathing (including washing, rinsing, drying)?: A Little Help from another  person to put on and taking off regular upper body clothing?: A Little Help from another person to put on and taking off regular lower body clothing?: A Lot 6 Click Score: 16   End of Session Equipment Utilized During Treatment: Other (comment) (sling) Nurse Communication: Mobility status  Activity Tolerance: Patient tolerated treatment well Patient left: in chair;with call bell/phone within reach  OT Visit Diagnosis:  Other abnormalities of gait and mobility (R26.89);Muscle weakness (generalized) (M62.81);Pain Pain - Right/Left: Right Pain - part of body: Shoulder (back, head)                Time: 7169-6789 OT Time Calculation (min): 24 min Charges:  OT General Charges $OT Visit: 1 Visit OT Evaluation $OT Eval Moderate Complexity: 1 Mod OT Treatments $Self Care/Home Management : 8-22 mins  Barry Brunner, OT Acute Rehabilitation Services Office [phone]   Chancy Milroy 09/21/2021, 11:53 AM

## 2021-09-21 NOTE — Evaluation (Signed)
Physical Therapy Evaluation Patient Details Name: Keith Schroeder MRN: [id number] DOB: [birth year] Today's Date: 09/21/2021  History of Present Illness  Keith Schroeder is a 61 y/o male with PMH of GERD, cholesystectomy admitted after a fall from attic with brief LOC found to have R scapular fracture, L1 TVP fx, L5 compression fx, and bifrontal contusions.  Clinical Impression  Patient presents with decreased mobility due to pain in back and R shoulder, dizziness, decreased balance and limited activity tolerance.  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  He needs mod A for bed mobility with back precautions and min A for ambulation limited today by increased headache and dizziness.  Likely has BPPV and may be multicanal given history, but needs further assessment and once able to tolerate repositioning treatment.  PT will continue to follow.  Recommend follow up outpatient PT at d/c.        Recommendations for follow up therapy are one component of a multi-disciplinary discharge planning process, led by the attending physician.  Recommendations may be updated based on patient status, additional functional criteria and insurance authorization.  Follow Up Recommendations Outpatient PT      Assistance Recommended at Discharge Frequent or constant Supervision/Assistance  Patient can return home with the following  Help with stairs or ramp for entrance;Assist for transportation;Assistance with cooking/housework;A little help with bathing/dressing/bathroom;A little help with walking and/or transfers    Equipment Recommendations Other (comment) (TBA possibly cane)  Recommendations for Other Services       Functional Status Assessment Patient has had a recent decline in their functional status and demonstrates the ability to make significant improvements in function in a reasonable and predictable amount of time.     Precautions /  Restrictions Precautions Precautions: Fall;Back Precaution Booklet Issued: No Required Braces or Orthoses: Sling;Spinal Brace Spinal Brace: Lumbar corset;Applied in sitting position Restrictions Weight Bearing Restrictions: Yes RUE Weight Bearing: Non weight bearing      Mobility  Bed Mobility Overal bed mobility: Needs Assistance Bed Mobility: Rolling, Sidelying to Sit Rolling: Min assist Sidelying to sit: Mod assist       General bed mobility comments: cues for technique, increased time, assist due to dizziness and pain    Transfers Overall transfer level: Needs assistance Equipment used: None Transfers: Sit to/from Stand Sit to Stand: Min guard           General transfer comment: assist for balance offerred HHA once standing    Ambulation/Gait Ambulation/Gait assistance: Min assist Gait Distance (Feet): 70 Feet Assistive device: IV Pole, 1 person hand held assist Gait Pattern/deviations: Step-to pattern, Step-through pattern, Decreased stride length       General Gait Details: slow and cues for visual targeting during turns to prevent increased dizziness, noted increased headache once in hallway.  Stairs            Wheelchair Mobility    Modified Rankin (Stroke Patients Only)       Balance Overall balance assessment: Needs assistance   Sitting balance-Leahy Scale: Good     Standing balance support: No upper extremity supported Standing balance-Leahy Scale: Fair Standing balance comment: UE support for dynamic tasks                             Pertinent Vitals/Pain Pain Assessment Pain Assessment: 0-10 Pain Score: 5  Pain Location: back, head Pain Descriptors / Indicators: Headache, Aching Pain  Intervention(s): Monitored during session, Premedicated before session, Repositioned    Home Living Family/patient expects to be discharged to:: Private residence Living Arrangements: Spouse/significant other;Children (wife and  son) Available Help at Discharge: Family Type of Home: House Home Access: Stairs to enter Entrance Stairs-Rails: None Entrance Stairs-Number of Steps: 4 Alternate Level Stairs-Number of Steps: 14 Home Layout: Two level   Additional Comments: wife works from home    Prior Function Prior Level of Function : Independent/Modified Independent             Mobility Comments: semi-retired after laid off in pandemic, watches grandaughter 6 y/o       Hand Dominance   Dominant Hand: Right    Extremity/Trunk Assessment   Upper Extremity Assessment Upper Extremity Assessment: RUE deficits/detail RUE Deficits / Details: AAROM up to about 100 degrees shoulder flexion with pain upon lowering; elbow, wrist and hand AROM WFL, strength shoulder flexion 2+/5, elbow flexion 4/5    Lower Extremity Assessment Lower Extremity Assessment: Overall WFL for tasks assessed (lifts legs antigravity, but hip flexion not tested due to back pain)       Communication   Communication: No difficulties  Cognition Arousal/Alertness: Awake/alert Behavior During Therapy: WFL for tasks assessed/performed Overall Cognitive Status: Within Functional Limits for tasks assessed                                 General Comments: using his phone to recall the date, able to recall events prior to fall, but reports amnestic to actual fall        General Comments General comments (skin integrity, edema, etc.): Noted some horizontal vertigo after rolling for bed mobility.  Educated pt likely with positional vertigo and on options for treatment, but too acutely painful right now to undergo treatment and possibly multicanal.  Discussed outpatient follow up and pt amenable and has easy access to [facility] outpatient rehab.    Exercises     Assessment/Plan    PT Assessment Patient needs continued PT services  PT Problem List Decreased strength;Decreased mobility;Decreased balance;Decreased knowledge  of use of DME;Decreased activity tolerance;Decreased knowledge of precautions;Decreased safety awareness;Pain       PT Treatment Interventions DME instruction;Therapeutic exercise;Gait training;Balance training;Functional mobility training;Therapeutic activities;Patient/family education;Stair training    PT Goals (Current goals can be found in the Care Plan section)  Acute Rehab PT Goals Patient Stated Goal: to return to independent PT Goal Formulation: With patient Time For Goal Achievement: 10/05/21 Potential to Achieve Goals: Good    Frequency Min 5X/week     Co-evaluation               AM-PAC PT "6 Clicks" Mobility  Outcome Measure Help needed turning from your back to your side while in a flat bed without using bedrails?: A Little Help needed moving from lying on your back to sitting on the side of a flat bed without using bedrails?: A Lot Help needed moving to and from a bed to a chair (including a wheelchair)?: A Little Help needed standing up from a chair using your arms (e.g., wheelchair or bedside chair)?: A Little Help needed to walk in hospital room?: A Little Help needed climbing 3-5 steps with a railing? : Total 6 Click Score: 15    End of Session Equipment Utilized During Treatment: Gait belt;Back brace;Other (comment) (sling) Activity Tolerance: Patient limited by pain Patient left: in chair;with call bell/phone within reach  PT Visit Diagnosis: Other abnormalities of gait and mobility (R26.89);Pain;BPPV Pain - Right/Left: Right Pain - part of body: Shoulder (& back)    Time: 5176-1607 PT Time Calculation (min) (ACUTE ONLY): 36 min   Charges:   PT Evaluation $PT Eval Moderate Complexity: 1 Mod PT Treatments $Gait Training: 8-22 mins        Sheran Lawless, PT Acute Rehabilitation Services Office:[phone] 09/21/2021   [name] 09/21/2021, 11:14 AM

## 2021-09-22 NOTE — Progress Notes (Signed)
Orthopedic Tech Progress Note Patient Details:  Keith Schroeder [birth year] [id number]  Arrived to apply sling to RUE, but pt already had a sling on. Pt and family member at bedside had no questions or concerns regarding the existing sling.   Patient ID: Keith Schroeder, male   DOB: 03-11-1960, 61 y.o.   MRN: [id number]  Docia Furl 09/22/2021, 5:37 PM

## 2021-09-22 NOTE — Progress Notes (Signed)
Overall progressing well.  Headache well controlled.  Dizziness much better.  No numbness paresthesias or weakness.  Patient got out of bed a little bit yesterday but still has not ambulated.  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  Continue efforts at mobilization.  Hopefully will be able to get him home in a couple days.

## 2021-09-22 NOTE — Progress Notes (Signed)
Physical Therapy Treatment Patient Details Name: Keith Schroeder MRN: [id number] DOB: 30-Jan-1961 Today's Date: 09/22/2021   History of Present Illness Keith Schroeder is a 61 y/o male admitted  8/14 after a fall from attic with brief LOC found to have R scapular fracture, L1 TVP fx, L5 compression fx, and bifrontal contusions. PMH of GERD, cholesystectomy.    PT Comments    Patient progressing some with distance/activity tolerance though still limited by nausea and headache with ambulation.  Patient's wife present and completed some education on back precautions, brace donning and BPPV.  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  Patient will benefit from continued skilled PT in the acute setting.  Have also asked mobility specialists to assist with further mobility.  Recommendations for follow up therapy are one component of a multi-disciplinary discharge planning process, led by the attending physician.  Recommendations may be updated based on patient status, additional functional criteria and insurance authorization.  Follow Up Recommendations  Outpatient PT     Assistance Recommended at Discharge Frequent or constant Supervision/Assistance  Patient can return home with the following Help with stairs or ramp for entrance;Assist for transportation;Assistance with cooking/housework;A little help with bathing/dressing/bathroom;A little help with walking and/or transfers   Equipment Recommendations  None recommended by PT    Recommendations for Other Services       Precautions / Restrictions Precautions Precautions: Fall;Back Precaution Comments: per ortho- no shoulder ROM, OK elbow/wrist/hand Required Braces or Orthoses: Sling;Spinal Brace Spinal Brace: Lumbar corset;Applied in sitting position Restrictions RUE Weight  Bearing: Non weight bearing     Mobility  Bed Mobility Overal bed mobility: Needs Assistance Bed Mobility: Rolling, Sidelying to Sit Rolling: Supervision Sidelying to sit: Min assist       General bed mobility comments: cues for technique, assist to lift trunk, noted geotropic nystagmus in L sidelying with mod c/o spinning lasting several seconds,    Transfers Overall transfer level: Needs assistance Equipment used: None Transfers: Sit to/from Stand Sit to Stand: Min guard           General transfer comment: assist for balance    Ambulation/Gait Ambulation/Gait assistance: Min guard Gait Distance (Feet): 160 Feet Assistive device: None Gait Pattern/deviations: Wide base of support, Decreased stride length       General Gait Details: offered HHA, but pt did not take; slower and cues for visual target to decrease dizziness, felt hot, nauseous and needing to urinate so turned around to get back to room   Stairs             Wheelchair Mobility    Modified Rankin (Stroke Patients Only)       Balance Overall balance assessment: Needs assistance   Sitting balance-Leahy Scale: Good       Standing balance-Leahy Scale: Fair Standing balance comment: standing to urinate in bathroom and wash hands with close S                            Cognition Arousal/Alertness: Awake/alert Behavior During Therapy: Flat affect Overall Cognitive Status: Within Functional Limits for tasks assessed  Exercises      General Comments General comments (skin integrity, edema, etc.): Wife present and supportive; discussed plans for progression prior to d/c home including stair negotiation, discussing car transfers, better at bed mobility and improved activity tolerance.  Educated with handouts given on back precautions and on BPPV and plan to defer to outpatient for treatment due to spinal/scap fx and pain.       Pertinent Vitals/Pain Pain Assessment Pain Assessment: Faces Faces Pain Scale: Hurts even more Pain Location: back, head Pain Descriptors / Indicators: Headache, Aching Pain Intervention(s): Monitored during session, Repositioned, Limited activity within patient's tolerance    Home Living                          Prior Function            PT Goals (current goals can now be found in the care plan section) Progress towards PT goals: Progressing toward goals    Frequency    Min 5X/week      PT Plan Current plan remains appropriate    Co-evaluation              AM-PAC PT "6 Clicks" Mobility   Outcome Measure  Help needed turning from your back to your side while in a flat bed without using bedrails?: A Little Help needed moving from lying on your back to sitting on the side of a flat bed without using bedrails?: A Little Help needed moving to and from a bed to a chair (including a wheelchair)?: A Little Help needed standing up from a chair using your arms (e.g., wheelchair or bedside chair)?: A Little Help needed to walk in hospital room?: A Little Help needed climbing 3-5 steps with a railing? : Total 6 Click Score: 16    End of Session Equipment Utilized During Treatment: Gait belt;Back brace Activity Tolerance: Patient limited by pain;Other (comment) (nausea)     PT Visit Diagnosis: Other abnormalities of gait and mobility (R26.89);Pain;BPPV Pain - Right/Left: Right Pain - part of body: Shoulder (and headache)     Time: 8466-5993 PT Time Calculation (min) (ACUTE ONLY): 36 min  Charges:  $Gait Training: 8-22 mins $Therapeutic Activity: 8-22 mins                     Sheran Lawless, PT Acute Rehabilitation Services [phone] 09/22/2021    [name] 09/22/2021, 2:31 PM

## 2021-09-23 MED ORDER — MECLIZINE HCL 25 MG PO TABS
25.0000 mg | ORAL_TABLET | Freq: Two times a day (BID) | ORAL | Status: DC | PRN
  Administered 2021-09-26: 25 mg via ORAL
  Filled 2021-09-23 (×2): qty 1

## 2021-09-23 NOTE — Progress Notes (Signed)
No new issues or problems.  Patient with stable headache.  Still having some vertigo.  Activity remains marginal for thoughts of discharge home.  Afebrile.  Vital signs are stable.  He is awake and alert.  He is oriented and reasonably appropriate.  His motor and sensory function are intact.  Status post significant multitrauma with bifrontal contusions, lumbar compression fracture and right scapular fracture.  Continue efforts at mobilization.  Likely home over the weekend.

## 2021-09-23 NOTE — Progress Notes (Signed)
Occupational Therapy Treatment Patient Details Name: Keith Schroeder MRN: [id number] DOB: [birth year] Today's Date: 09/23/2021   History of present illness Keith Schroeder is a 61 y/o male admitted  8/14 after a fall from attic with brief LOC found to have R scapular fracture, L1 TVP fx, L5 compression fx, and bifrontal contusions. PMH of GERD, cholesystectomy.   OT comments  Pt progressing towards acute OT goals. Remains with + dizziness with slight positional changes. Worked on incorporating gaze stabilization technique during OOB ADLs. D/c recommendation remains appropriate.    Recommendations for follow up therapy are one component of a multi-disciplinary discharge planning process, led by the attending physician.  Recommendations may be updated based on patient status, additional functional criteria and insurance authorization.    Follow Up Recommendations  No OT follow up    Assistance Recommended at Discharge Intermittent Supervision/Assistance  Patient can return home with the following  A little help with walking and/or transfers;A lot of help with bathing/dressing/bathroom;Assistance with cooking/housework;Assist for transportation;Help with stairs or ramp for entrance   Equipment Recommendations  BSC/3in1    Recommendations for Other Services      Precautions / Restrictions Precautions Precautions: Fall;Back Precaution Booklet Issued: No Precaution Comments: per ortho- no shoulder ROM, OK elbow/wrist/hand Required Braces or Orthoses: Sling;Spinal Brace Spinal Brace: Lumbar corset;Applied in sitting position Restrictions Weight Bearing Restrictions: Yes RUE Weight Bearing: Non weight bearing       Mobility Bed Mobility Overal bed mobility: Needs Assistance Bed Mobility: Rolling, Sidelying to Sit Rolling: Supervision Sidelying to sit: Min guard            Transfers Overall transfer level: Needs assistance Equipment used: None Transfers: Sit to/from Stand Sit to  Stand: Min guard                 Balance Overall balance assessment: Needs assistance   Sitting balance-Keith Schroeder Scale: Good     Standing balance support: No upper extremity supported, During functional activity Standing balance-Keith Schroeder Scale: Fair Standing balance comment: standing to urinate in bathroom                           ADL either performed or assessed with clinical judgement   ADL Overall ADL's : Needs assistance/impaired                         Toilet Transfer: Min guard;Ambulation           Functional mobility during ADLs: Min guard General ADL Comments: incorporated gaze stabilization into OOB ADLS d/t increased dizziness with positional changes.    Extremity/Trunk Assessment Upper Extremity Assessment Upper Extremity Assessment: RUE deficits/detail RUE Deficits / Details: per ortho- no shoulder ROM; WFL elbow and distal. in sling   Lower Extremity Assessment Lower Extremity Assessment: Defer to PT evaluation        Vision       Perception     Praxis      Cognition Arousal/Alertness: Awake/alert Behavior During Therapy: Flat affect Overall Cognitive Status: Within Functional Limits for tasks assessed                                          Exercises      Shoulder Instructions       General Comments      Pertinent Vitals/ Pain  Pain Assessment Pain Assessment: 0-10 Faces Pain Scale: Hurts even more Pain Location: back, head Pain Descriptors / Indicators: Headache, Aching Pain Intervention(s): Monitored during session, Repositioned, Limited activity within patient's tolerance  Home Living                                          Prior Functioning/Environment              Frequency  Min 3X/week        Progress Toward Goals  OT Goals(current goals can now be found in the care plan section)  Progress towards OT goals: Progressing toward goals  Acute Rehab  OT Goals Patient Stated Goal: get better OT Goal Formulation: With patient Time For Goal Achievement: 10/05/21 Potential to Achieve Goals: Good ADL Goals Pt Will Perform Grooming: with modified independence;standing Pt Will Perform Lower Body Dressing: with modified independence;sit to/from stand;with adaptive equipment Pt Will Transfer to Toilet: with modified independence;ambulating Pt Will Perform Tub/Shower Transfer: Shower transfer;with modified independence;ambulating;3 in 1 Pt/caregiver will Perform Home Exercise Program: Right Upper extremity;With written HEP provided  Plan Discharge plan remains appropriate    Co-evaluation                 AM-PAC OT "6 Clicks" Daily Activity     Outcome Measure   Help from another person eating meals?: A Little Help from another person taking care of personal grooming?: A Little Help from another person toileting, which includes using toliet, bedpan, or urinal?: A Little Help from another person bathing (including washing, rinsing, drying)?: A Little Help from another person to put on and taking off regular upper body clothing?: A Little Help from another person to put on and taking off regular lower body clothing?: A Little 6 Click Score: 18    End of Session Equipment Utilized During Treatment: Other (comment) (L UE sling)  OT Visit Diagnosis: Other abnormalities of gait and mobility (R26.89);Muscle weakness (generalized) (M62.81);Pain Pain - Right/Left: Right Pain - part of body: Shoulder (back, head)   Activity Tolerance Other (comment) (+ dizziness with slight positional changes)   Patient Left Other (comment) (with PT)   Nurse Communication          Time: 0762-2633 OT Time Calculation (min): 22 min  Charges: OT General Charges $OT Visit: 1 Visit OT Evaluation $OT Eval Low Complexity: 1 Low  Raynald Kemp, OT Acute Rehabilitation Services Office: [phone]   Pilar Grammes 09/23/2021, 10:56 AM

## 2021-09-23 NOTE — Progress Notes (Signed)
Patient is complaining of echo noise in his right ear.

## 2021-09-23 NOTE — Progress Notes (Signed)
Physical Therapy Treatment Patient Details Name: Keith Schroeder MRN: [id number] DOB: 24-Jun-1960 Today's Date: 09/23/2021   History of Present Illness Keith Schroeder is a 61 y/o male admitted  8/14 after a fall from attic with brief LOC found to have R scapular fracture, L1 TVP fx, L5 compression fx, and bifrontal contusions. PMH of GERD, cholesystectomy.    PT Comments    Treatment limited due to symptomatic when attempting to determine location of BPPV.  Patient with geotropic nystagmus in R sidelying then noted rotary component with L sidelying and pt unable to tolerate further testing.  Continue to feel he will need outpatient for treatment, but asked MD about medication to minimize symptoms to allow improved tolerance to mobility.  PT will continue to follow.    Recommendations for follow up therapy are one component of a multi-disciplinary discharge planning process, led by the attending physician.  Recommendations may be updated based on patient status, additional functional criteria and insurance authorization.  Follow Up Recommendations  Outpatient PT     Assistance Recommended at Discharge Frequent or constant Supervision/Assistance  Patient can return home with the following Help with stairs or ramp for entrance;Assist for transportation;Assistance with cooking/housework;A little help with bathing/dressing/bathroom;A little help with walking and/or transfers   Equipment Recommendations  None recommended by PT    Recommendations for Other Services       Precautions / Restrictions Precautions Precautions: Fall;Back Precaution Comments: per ortho- no shoulder ROM, OK elbow/wrist/hand Required Braces or Orthoses: Sling;Spinal Brace Spinal Brace: Lumbar corset;Applied in sitting position Restrictions Weight Bearing Restrictions: Yes RUE Weight Bearing: Non weight bearing     Mobility  Bed Mobility Overal bed mobility: Needs Assistance Bed Mobility: Rolling Rolling: Min  assist         General bed mobility comments: rolling in bed to assess vertigo, noted geotropic nystagmus in R sidelying lasting about 30 sec, then in L sidelying noted slow onset L rotary nystagmus and pt unable to tolerate further due to nausea.    Transfers Overall transfer level: Needs assistance Equipment used: None   Sit to Stand: Min guard           General transfer comment: assist to recliner from bathroom following OT    Ambulation/Gait Ambulation/Gait assistance: Min guard Gait Distance (Feet): 10 Feet Assistive device: None         General Gait Details: assist for safety, but did not walk in hallway due to symptomatic following OT session in bathroom   Stairs             Wheelchair Mobility    Modified Rankin (Stroke Patients Only)       Balance           Standing balance support: No upper extremity supported Standing balance-Leahy Scale: Fair                              Cognition Arousal/Alertness: Awake/alert Behavior During Therapy: Flat affect Overall Cognitive Status: Within Functional Limits for tasks assessed                                          Exercises      General Comments General comments (skin integrity, edema, etc.): initially finishing up with OT then fatigued and somewhat symptomatic after finishing so assisted to chair, then returned and  pt wanting to lie down but bed broken so nursing staff getting new bed, when returned pt in bed and initiated vertigo assessment but pt unable to complete.      Pertinent Vitals/Pain Pain Assessment Pain Assessment: Faces Faces Pain Scale: Hurts even more Pain Location: back, head Pain Descriptors / Indicators: Headache, Aching Pain Intervention(s): Monitored during session    Home Living                          Prior Function            PT Goals (current goals can now be found in the care plan section) Progress towards PT  goals: Progressing toward goals    Frequency    Min 5X/week      PT Plan Current plan remains appropriate    Co-evaluation              AM-PAC PT "6 Clicks" Mobility   Outcome Measure  Help needed turning from your back to your side while in a flat bed without using bedrails?: A Little Help needed moving from lying on your back to sitting on the side of a flat bed without using bedrails?: A Little Help needed moving to and from a bed to a chair (including a wheelchair)?: A Little Help needed standing up from a chair using your arms (e.g., wheelchair or bedside chair)?: A Little Help needed to walk in hospital room?: Total Help needed climbing 3-5 steps with a railing? : Total 6 Click Score: 14    End of Session   Activity Tolerance: Patient limited by fatigue;Other (comment) (dizziness/nausea)     PT Visit Diagnosis: Other abnormalities of gait and mobility (R26.89);Pain;BPPV Pain - Right/Left: Right Pain - part of body: Shoulder     Time: 9833-8250 PT Time Calculation (min) (ACUTE ONLY): 15 min  Charges:  $Therapeutic Activity: 8-22 mins                     Sheran Lawless, PT Acute Rehabilitation Services Office:[phone] 09/23/2021    Keith Schroeder 09/23/2021, 5:44 PM

## 2021-09-24 NOTE — Progress Notes (Signed)
Physical Therapy Treatment Patient Details Name: Keith Schroeder MRN: [id number] DOB: December 13, 1960 Today's Date: 09/24/2021   History of Present Illness Keith Schroeder is a 61 y/o male admitted  8/14 after a fall from attic with brief LOC found to have R scapular fracture, L1 TVP fx, L5 compression fx, and bifrontal contusions. PMH of GERD, cholesystectomy.    PT Comments    The pt was able to make good progress with OOB mobility, and ambulated hallway distances today with minG for safety and no need for UE support. He continues to have moments of instability and was limited by headache, but is highly motivated to continue and progress to home. The pt also completed 10 stairs and was educated on safety and guarding as the pt's home does not have railings for stability. The pt will continue to benefit from skilled PT acutely to continue addressing vestibular dysfunction and to progress mobility and stability to allow for safe return home.     Recommendations for follow up therapy are one component of a multi-disciplinary discharge planning process, led by the attending physician.  Recommendations may be updated based on patient status, additional functional criteria and insurance authorization.  Follow Up Recommendations  Outpatient PT (OP neuro)     Assistance Recommended at Discharge Frequent or constant Supervision/Assistance  Patient can return home with the following Help with stairs or ramp for entrance;Assist for transportation;Assistance with cooking/housework;A little help with bathing/dressing/bathroom;A little help with walking and/or transfers   Equipment Recommendations  None recommended by PT    Recommendations for Other Services       Precautions / Restrictions Precautions Precautions: Fall;Back Precaution Booklet Issued: No Precaution Comments: per ortho- no shoulder ROM, OK elbow/wrist/hand Required Braces or Orthoses: Sling;Spinal Brace Spinal Brace: Lumbar corset;Applied  in sitting position Restrictions Weight Bearing Restrictions: Yes RUE Weight Bearing: Non weight bearing     Mobility  Bed Mobility Overal bed mobility: Needs Assistance Bed Mobility: Sit to Supine       Sit to supine: Min guard   General bed mobility comments: minG for safety, no assist given    Transfers Overall transfer level: Needs assistance Equipment used: None Transfers: Sit to/from Stand Sit to Stand: Min guard           General transfer comment: minG for safety, pt spouse asking about cane for steadying with initial stand. pt with no instability noted    Ambulation/Gait Ambulation/Gait assistance: Min guard Gait Distance (Feet): 250 Feet Assistive device: None Gait Pattern/deviations: Wide base of support, Decreased stride length   Gait velocity interpretation: 1.31 - 2.62 ft/sec, indicative of limited community ambulator   General Gait Details: slightly wide BOS, intermittent staggering steps/hesitations when stepping during which the pt did not need increased assist. noted continued hheadache in hallway.   Stairs Stairs: Yes Stairs assistance: Min guard Stair Management: No rails, One rail Left, Alternating pattern, Forwards Number of Stairs: 10 General stair comments: HHA with no rail ascending, single UE support on rail descending     Balance Overall balance assessment: Needs assistance Sitting-balance support: No upper extremity supported Sitting balance-Leahy Scale: Good     Standing balance support: No upper extremity supported, During functional activity Standing balance-Leahy Scale: Fair Standing balance comment: minG but no UE support for gait             High level balance activites: Backward walking, Direction changes, Sudden stops High Level Balance Comments: no LOB or increased assist needed  Cognition Arousal/Alertness: Awake/alert Behavior During Therapy: Flat affect Overall Cognitive Status: Within  Functional Limits for tasks assessed                                          Exercises      General Comments General comments (skin integrity, edema, etc.): VSS oN RA      Pertinent Vitals/Pain Pain Assessment Pain Assessment: Faces Faces Pain Scale: Hurts little more Pain Location: back, head Pain Descriptors / Indicators: Headache, Aching Pain Intervention(s): Limited activity within patient's tolerance, Monitored during session, Repositioned     PT Goals (current goals can now be found in the care plan section) Acute Rehab PT Goals Patient Stated Goal: to return to independent PT Goal Formulation: With patient Time For Goal Achievement: 10/05/21 Potential to Achieve Goals: Good Progress towards PT goals: Progressing toward goals    Frequency    Min 5X/week      PT Plan Current plan remains appropriate       AM-PAC PT "6 Clicks" Mobility   Outcome Measure  Help needed turning from your back to your side while in a flat bed without using bedrails?: A Little Help needed moving from lying on your back to sitting on the side of a flat bed without using bedrails?: A Little Help needed moving to and from a bed to a chair (including a wheelchair)?: A Little Help needed standing up from a chair using your arms (e.g., wheelchair or bedside chair)?: A Little Help needed to walk in hospital room?: A Little Help needed climbing 3-5 steps with a railing? : A Little 6 Click Score: 18    End of Session Equipment Utilized During Treatment: Gait belt;Back brace Activity Tolerance: Patient tolerated treatment well;Patient limited by pain Patient left: in bed;with call bell/phone within reach;with family/visitor present Nurse Communication: Mobility status PT Visit Diagnosis: Other abnormalities of gait and mobility (R26.89);Pain;BPPV Pain - part of body: Shoulder     Time: 1610-9604 PT Time Calculation (min) (ACUTE ONLY): 27 min  Charges:  $Gait  Training: 8-22 mins $Therapeutic Activity: 8-22 mins                     Vickki Muff, PT, DPT   Acute Rehabilitation Department   Ronnie Derby 09/24/2021, 6:35 PM

## 2021-09-24 NOTE — Progress Notes (Signed)
Subjective: Patient reports intermittent bilateral frontal headaches that respond well to analgesics.  He continues to have vertigo.  Had bowel movement overnight which was his first bowel movement since admission.  No acute events overnight.  Objective: Vital signs in last 24 hours: Temp:  [98.5 F (36.9 C)-99.3 F (37.4 C)] 98.5 F (36.9 C) (08/19 0325) Pulse Rate:  [87-96] 93 (08/19 0325) Resp:  [10-19] 19 (08/19 0325) BP: (124-137)/(75-84) 134/80 (08/19 0325) SpO2:  [93 %-96 %] 93 % (08/19 0325)  Intake/Output from previous day: 08/18 0701 - 08/19 0700 In: 300 [P.O.:300] Out: 500 [Urine:500] Intake/Output this shift: No intake/output data recorded.  Physical Exam: Patient is awake, A/O X 4, conversant, and in good spirits. Eyes open spontaneously. They are in NAD and VSS. Doing well. Speech is fluent and appropriate. MAEW. Sensation to light touch is intact. PERLA, EOMI. CNs grossly intact.     Lab Results: No results for input(s): "WBC", "HGB", "HCT", "PLT" in the last 72 hours. BMET No results for input(s): "NA", "K", "CL", "CO2", "GLUCOSE", "BUN", "CREATININE", "CALCIUM" in the last 72 hours.  Studies/Results: No results found.  Assessment/Plan: 61 year old male who is s/p polytrauma with bifrontal contusions, lumbar compression fracture, and right scapular fracture.  His ability to participate with therapy has continued to be limited due to vertigo.  Meclizine 25 mg p.o. twice daily as needed was added to the patient's medication list yesterday evening.  His neurological examination is stable.  Continue efforts at mobilization.  Continue PT/OT.  OT recommending no posthospital follow-up needed.  PT recommending outpatient PT.  Will hopefully be ready for discharge tomorrow.  LOS: 5 days     Council Mechanic, DNP, AGNP-C Neurosurgery Nurse Practitioner  [facility] [address], [place], [place] [address] P: [phone]     F: [phone]  09/24/2021 8:03 AM

## 2021-09-25 NOTE — Progress Notes (Signed)
Subjective: Patient reports intermittent headaches persists. Headaches continue to respond well to analgesics. He notes vertigo type symptoms improving and typically lasts for approximately 30 seconds after getting OOB and then resolves. NAE ON.   Objective: Vital signs in last 24 hours: Temp:  [97.8 F (36.6 C)-98.9 F (37.2 C)] 98.2 F (36.8 C) (08/20 0732) Pulse Rate:  [77-94] 77 (08/20 0732) Resp:  [12-17] 15 (08/20 0732) BP: (123-135)/(76-86) 123/76 (08/20 0732) SpO2:  [93 %-100 %] 100 % (08/20 0732)  Intake/Output from previous day: No intake/output data recorded. Intake/Output this shift: No intake/output data recorded.  Physical Exam: Patient is asleep but arouses easily to voice and maintains his alertness throughout examination. O X 4, conversant, and in good spirits. They are in NAD and VSS. Speech is fluent and appropriate. MAEW. Sensation to light touch is intact. PERLA, EOMI. CNs grossly intact.     Lab Results: No results for input(s): "WBC", "HGB", "HCT", "PLT" in the last 72 hours. BMET No results for input(s): "NA", "K", "CL", "CO2", "GLUCOSE", "BUN", "CREATININE", "CALCIUM" in the last 72 hours.  Studies/Results: No results found.  Assessment/Plan: 61 year old male who is s/p polytrauma with bifrontal contusions, lumbar compression fracture, and right scapular fracture. Vertigo symptoms slightly improved. PT notes good progress with yesterdays therapy session. His neurological examination is stable.  Continue efforts at mobilization.  Continue PT/OT.  OT recommending no posthospital follow-up needed.  PT recommending outpatient PT. Work on titrating off IV pain medication today and utilize POs only to prepare for probable discharge tomorrow. Continue with therapy today. If does well with therapy today, will plan for discharge tomorrow.      LOS: 6 days   Council Mechanic, DNP, AGNP-C Neurosurgery Nurse Practitioner  [facility] [address], [place], [place] [address] P: [phone]    F: [phone]  09/25/2021 8:33 AM

## 2021-09-26 MED ORDER — MECLIZINE HCL 25 MG PO TABS: 25.0000 mg | ORAL_TABLET | Freq: Two times a day (BID) | ORAL | 0 refills | Status: AC | PRN

## 2021-09-26 MED ORDER — HYDROCODONE-ACETAMINOPHEN 5-325 MG PO TABS: 1.0000 | ORAL_TABLET | ORAL | 0 refills | Status: AC | PRN

## 2021-09-26 NOTE — TOC Progression Note (Addendum)
Transition of Care [facility]) - Progression Note    Patient Details  Name: Keith Schroeder MRN: [id number] Date of Birth: July 30, 1960  Transition of Care [facility]) CM/SW Contact  Beckie Busing, RN Phone Number:[phone]  09/26/2021, 1:24 PM  Clinical Narrative:    Patient discharging with DME needs. 3in 1 ordered per [employer] and to be delivered to the bedside.   1427 CM informed that patient does not want to wait for delivery of 3in1 and is ready to go home and have 3in1 shipped to home. 3 in 1 is currently being delivered to the bedside.         Expected Discharge Plan and Services           Expected Discharge Date: 09/26/21                                     Social Determinants of Health (SDOH) Interventions    Readmission Risk Interventions     No data to display

## 2021-09-26 NOTE — Discharge Instructions (Signed)

## 2021-09-26 NOTE — Discharge Summary (Signed)
Physician Discharge Summary  Patient ID: Keith Schroeder MRN: [id number] DOB/AGE: 62/25/1962 62 y.o.  Admit date: 09/19/2021 Discharge date: 09/26/2021  Admission Diagnoses:  Discharge Diagnoses:  Principal Problem:   TBI (traumatic brain injury) [facility])   Discharged Condition: good  Hospital Course: Patient admitted to the hospital after significant fall.  Patient with multiple injuries including significant traumatic brain injury with bifrontal contusions, right scapular fracture, and L5 compression fracture.  Patient has progressed well with therapy.  He still has some positional vertigo but this is improving.  He feels ready for discharge home.  Consults:   Significant Diagnostic Studies:   Treatments:   Discharge Exam: Blood pressure 132/83, pulse 94, temperature 97.7 F (36.5 C), temperature source Oral, resp. rate 12, height 5' 10.5" (1.791 m), weight 79.4 kg, SpO2 93 %. Awake and alert.  Oriented and appropriate.  Speech is fluent.  Judgment insight are intact.  Cranial nerve function normal bilaterally.  Motor examination 5/5 although his right upper extremity movement is somewhat limited secondary to his shoulder injury.  He has a small wound in his right occipital region which is healing well.  Disposition: Discharge disposition: 01-Home or Self Care       Discharge Instructions     Ambulatory referral to Physical Therapy   Complete by: As directed    Vestibular rehab @ [facility]      Allergies as of 09/26/2021       Reactions   Clindamycin Hcl Rash        Medication List     TAKE these medications    acetaminophen 500 MG tablet Commonly known as: TYLENOL Take 1,000 mg by mouth every 6 (six) hours as needed for mild pain.   citalopram 20 MG tablet Commonly known as: CELEXA Take 20 mg by mouth daily.   fluticasone 50 MCG/ACT nasal spray Commonly known as: FLONASE Place 2 sprays into both nostrils daily as needed for allergies or rhinitis.    HYDROcodone-acetaminophen 5-325 MG tablet Commonly known as: NORCO/VICODIN Take 1-2 tablets by mouth every 4 (four) hours as needed for moderate pain.   ibuprofen 200 MG tablet Commonly known as: ADVIL Take 400 mg by mouth every 6 (six) hours as needed for mild pain.   meclizine 25 MG tablet Commonly known as: ANTIVERT Take 1 tablet (25 mg total) by mouth 2 (two) times daily as needed for dizziness.   multivitamin capsule Take 1 capsule by mouth daily.   omeprazole 20 MG capsule Commonly known as: PRILOSEC Take 20 mg by mouth daily.        Follow-up Information     Durene Romans, MD. Schedule an appointment as soon as possible for a visit in 2 week(s).   Specialty: Orthopedic Surgery Contact information: [address] [place] [address] [place] [address] [phone]         Outpatient rehab at [facility] Follow up.   Why: Your outpatient vestibular rehab hhas been set up at Outpatient rehab at [facility]. The office will call you with start of care information. If you have any questions or concerns please call the number above. Contact information: [address] [place], [place] [address] Phone: [phone]                Signed: Temple Pacini 09/26/2021, 12:18 PM

## 2021-09-26 NOTE — Progress Notes (Signed)
PT Cancellation Note  Patient Details Name: Keith Schroeder MRN: [id number] DOB: 24-Apr-1960   Cancelled Treatment:    Reason Eval/Treat Not Completed: Other (comment); patient up walking in room to bathroom unaided and wife in the room reporting he is being discharged.  She had questions about 3:1 and discharge paperwork.  RN CM informed about DME and passed along information to nursing staff about needing further education with d/c paperwork.  Noted follow up set up for outpatient vestibular rehab so will defer further PT to outpatient setting.    [name] 09/26/2021, 1:21 PM Sheran Lawless, PT Acute Rehabilitation Services Office:[phone] 09/26/2021

## 2021-09-30 NOTE — Therapy (Signed)
OUTPATIENT PHYSICAL THERAPY VESTIBULAR EVALUATION     Patient Name: Keith Schroeder MRN: [id number] DOB:December 21, 1960, 61 y.o., male Today's Date: 10/03/2021  PCP: Johny Blamer, MD REFERRING PROVIDER: Julio Sicks, MD   PT End of Session - 10/03/21 1201     Visit Number 1    Number of Visits 13    Date for PT Re-Evaluation 11/14/21    Authorization Type [employer]    PT Start Time 0848    PT Stop Time 0931    PT Time Calculation (min) 43 min    Equipment Utilized During Treatment Back brace;Other (comment)   R shoulder sling   Activity Tolerance Patient tolerated treatment well   session limited by shoulder and back precautions   Behavior During Therapy Agitated             Past Medical History:  Diagnosis Date   Anxiety    GERD (gastroesophageal reflux disease)    Past Surgical History:  Procedure Laterality Date   CHOLECYSTECTOMY     Patient Active Problem List   Diagnosis Date Noted   TBI (traumatic brain injury) (HCC) 09/19/2021    ONSET DATE: 09/19/21  REFERRING DIAG: T14.90XA (ICD-10-CM) - Trauma  THERAPY DIAG:  BPPV (benign paroxysmal positional vertigo), bilateral  Dizziness and giddiness  Other symptoms and signs involving the nervous system  Unsteadiness on feet  Rationale for Evaluation and Treatment Rehabilitation  SUBJECTIVE:   SUBJECTIVE STATEMENT: Patient reports dizziness since his fall. Getting better now. Worse when laying down for prolonged periods and feels it with rolling to get out of bed. Has been laying supine since his fall d/t pain. Reports remaining pain as he was instructed not to take Ibuprofen. Episodes last seconds and are described as spinning. Denies changes in balance. Denies infection/illness, vision changes/double vision, hearing loss, tinnitus, otalgia. Reports some photo/phonophobia. Reports HAs in the frontal aspect of the head.    Pt accompanied by: significant other  PERTINENT HISTORY: anxiety, GERD   PAIN:  Are  you having pain? Yes: NPRS scale: 3-4/10 Pain location: generalized Pain description: aching  Aggravating factors: movement Relieving factors: hydrocodone  PRECAUTIONS: Back; R scapular fx in sling at all times  WEIGHT BEARING RESTRICTIONS No  FALLS: Has patient fallen in last 6 months? Yes. Number of falls 1  LIVING ENVIRONMENT: Lives with: lives with their spouse Lives in: House/apartment Stairs:  4 steps to enter; 14 steps to basement Has following equipment at home: Shower bench and Grab bars  PLOF: Independent  PATIENT GOALS improve dizziness   OBJECTIVE:   DIAGNOSTIC FINDINGS: 09/20/21 head CT: 1. Multifocal traumatic intracranial hemorrhage: mild progression of anterior bifrontal hemorrhagic contusions. No associated mass effect. small volume bilateral subarachnoid hemorrhage not significantly changed. only minimal left side subdural hematoma visible now by CT. No significant intracranial mass effect. No IVH or ventriculomegaly. Nondisplaced right side skull fracture tracking from the foramen magnum toward the vertex. Right scalp hematoma.  No new intracranial abnormality.  COGNITION: Overall cognitive status:  pt and wife report aggravation and hyperfixation on aspects of his current situation/injury.    SENSATION: Reports N/T in R most medial fingers   POSTURE: rounded shoulders  GAIT: Gait pattern: decreased step length- Right and decreased step length- Left Assistive device utilized: None Level of assistance: Modified independence Comments: slow and guarded gait   PATIENT SURVEYS:  FOTO [phone]   VESTIBULAR ASSESSMENT   GENERAL OBSERVATION: R shoulder in sling, dressing over the R posterior head; patient is wearing bifocals which  he wears most of the time       OCULOMOTOR EXAM:   Ocular Alignment: normal   Ocular ROM: No Limitations   Spontaneous Nystagmus: absent   Gaze-Induced Nystagmus: absent   Smooth Pursuits: intact   Saccades:  hypermetric/overshoots and very slight overshooting in inferior direction   Convergence/Divergence: 1 cm    VESTIBULAR - OCULAR REFLEX:    Slow VOR: Normal    VOR Cancellation: Normal   Head-Impulse Test: HIT Right: negative HIT Left: negative    POSITIONAL TESTING:  *nonfatiguing downbeating nystagmus upon laying supine Right Roll Test: downbeating R torsional vs. L upbeating torsional nystagmus; Duration: 1.5 min Left Roll Test: R upbeating torsional nystagmus; Duration: pt unable to tolerate full duration of test d/t c/o nausea     VESTIBULAR TREATMENT:   PATIENT EDUCATION: Education details: prognosis, POC, edu on BPPV and received verbal consent to message Dr. Charlann Boxer concerning R shoulder precautions to allow for CRM Person educated: Patient and Spouse Education method: Explanation, Demonstration, Tactile cues, and Verbal cues Education comprehension: verbalized understanding   GOALS: Goals reviewed with patient? Yes  SHORT TERM GOALS: Target date: 10/24/2021  Patient to be independent with initial HEP. Baseline: HEP initiated Goal status: INITIAL    LONG TERM GOALS: Target date: 11/14/2021  Patient to be independent with advanced HEP. Baseline: Not yet initiated  Goal status: INITIAL  Patient will report 0/10 dizziness with bed mobility.  Baseline: Symptomatic  Goal status: INITIAL  Patient to demonstrate mild sway with M-CTSIB condition with eyes closed/foam surface in order to improve safety in environments with uneven surfaces and dim lighting. Baseline: NT Goal status: INITIAL  Patient to score at least 20/24 on DGI in order to decrease risk of falls. Baseline: NT Goal status: INITIAL  Patient to score at least 59 on FOTO in order to indicate improved functional outcomes.  Baseline: 52 Goal status: INITIAL   ASSESSMENT:  CLINICAL IMPRESSION:   Patient is a 61 y/o M presenting to OPPT with c/o dizziness s/p fall from the attic resulting in R  scapular fracture, L1 TVP fx, L5 compression fx, and TBI with bifrontal contusions on 09/19/21. Patient was Dc'd home on 09/26/21. Reports improving dizziness which is described as spinning and lasts seconds. Worse when rolling to get out of bed. Also reports photo/phonophobia and frontal HAs. Oculomotor exam revealed very slight overshooting with inferior saccades. Positional testing showed difficult to discern nystagmus with R/L roll test. Did not proceed further d/t precautions to avoid R sidelying d/t R scapular fx. Patient also presents with gait deviations and decreased gait speed. Would benefit from skilled PT services 1-2x/week for 6 weeks to address aforementioned impairments in order to optimize level of function.     OBJECTIVE IMPAIRMENTS Abnormal gait, decreased activity tolerance, decreased balance, dizziness, and postural dysfunction.   ACTIVITY LIMITATIONS carrying, lifting, bending, sitting, standing, squatting, sleeping, stairs, transfers, bed mobility, bathing, dressing, and reach over head  PARTICIPATION LIMITATIONS: meal prep, cleaning, laundry, driving, shopping, community activity, yard work, and church  PERSONAL FACTORS Age, Behavior pattern, Past/current experiences, Time since onset of injury/illness/exacerbation, and 1-2 comorbidities: anxiety, GERD  are also affecting patient's functional outcome.   REHAB POTENTIAL: Good  CLINICAL DECISION MAKING: Evolving/moderate complexity  EVALUATION COMPLEXITY: Moderate   PLAN: PT FREQUENCY: 1-2x/week  PT DURATION: 6 weeks  PLANNED INTERVENTIONS: Therapeutic exercises, Therapeutic activity, Neuromuscular re-education, Balance training, Gait training, Patient/Family education, Self Care, Joint mobilization, Stair training, Vestibular training, Canalith repositioning, Aquatic Therapy, Dry Needling, Electrical  stimulation, Cryotherapy, Moist heat, Taping, Manual therapy, and Re-evaluation  PLAN FOR NEXT SESSION: tx will depend on  clearance by patient's orthopedic MD (Dr. Charlann Boxer) on R sidelying; otherwise CRM may be limited    Anette Guarneri, PT, DPT 10/03/21 12:15 PM  [facility] Out[facility] Rehab at [facility] [address] [place], [place] [address] Phone # [phone] Fax # [phone]

## 2021-10-03 ENCOUNTER — Other Ambulatory Visit: Payer: Self-pay

## 2021-10-03 ENCOUNTER — Encounter: Payer: Self-pay | Admitting: Physical Therapy

## 2021-10-03 ENCOUNTER — Telehealth: Payer: Self-pay | Admitting: Physical Therapy

## 2021-10-03 ENCOUNTER — Ambulatory Visit: Payer: Managed Care, Other (non HMO) | Attending: Neurosurgery | Admitting: Physical Therapy

## 2021-10-03 DIAGNOSIS — R2681 Unsteadiness on feet: Secondary | ICD-10-CM | POA: Insufficient documentation

## 2021-10-03 DIAGNOSIS — T1490XA Injury, unspecified, initial encounter: Secondary | ICD-10-CM | POA: Diagnosis not present

## 2021-10-03 DIAGNOSIS — R29818 Other symptoms and signs involving the nervous system: Secondary | ICD-10-CM | POA: Diagnosis present

## 2021-10-03 DIAGNOSIS — R42 Dizziness and giddiness: Secondary | ICD-10-CM | POA: Insufficient documentation

## 2021-10-03 DIAGNOSIS — H8113 Benign paroxysmal vertigo, bilateral: Secondary | ICD-10-CM | POA: Diagnosis present

## 2021-10-03 NOTE — Telephone Encounter (Signed)
Hello,   I evaluated Mr. Kley today in OPPT for his c/o vertigo s/p fall resulting in R scapular fracture, L1 transverse process fx, L5 compression fx, and TBI with bifrontal contusions per referral from Dr. Julio Sicks. The patient appears to have BPPV which requires canalith repositioning maneuvers. I wanted to know if you felt it was safe for [place] to roll the patient into R sidelying for this treatment with his R scapular fx or if you would like [place] to hold off. Please advise.  Thanks!  Anette Guarneri, PT, DPT 10/03/21 12:19 PM  [facility] at [facility] [address] [place], [address] [place], [place] [address] Phone # [phone] Fax # [phone]

## 2021-10-11 NOTE — Therapy (Signed)
OUTPATIENT PHYSICAL THERAPY VESTIBULAR TREATMENT     Patient Name: Keith Schroeder MRN: [id number] DOB:08/11/1960, 60 y.o., male Today's Date: 10/12/2021  PCP: Harris, William, MD REFERRING PROVIDER: Pool, Henry, MD   PT End of Session - 10/12/21 1607     Visit Number 2    Number of Visits 13    Date for PT Re-Evaluation 11/14/21    Authorization Type [employer]    PT Start Time 1533    PT Stop Time 1607    PT Time Calculation (min) 34 min    Equipment Utilized During Treatment Back brace;Other (comment)   R shoulder sling   Activity Tolerance Patient tolerated treatment well   session limited by shoulder and back precautions   Behavior During Therapy WFL for tasks assessed/performed              Past Medical History:  Diagnosis Date   Anxiety    GERD (gastroesophageal reflux disease)    Past Surgical History:  Procedure Laterality Date   CHOLECYSTECTOMY     Patient Active Problem List   Diagnosis Date Noted   TBI (traumatic brain injury) ([facility]) 09/19/2021    ONSET DATE: 09/19/21  REFERRING DIAG: T14.90XA (ICD-10-CM) - Trauma  THERAPY DIAG:  BPPV (benign paroxysmal positional vertigo), bilateral  Dizziness and giddiness  Other symptoms and signs involving the nervous system  Unsteadiness on feet  Rationale for Evaluation and Treatment Rehabilitation  SUBJECTIVE:   SUBJECTIVE STATEMENT: The vertigo is just about gone now. Doesn't feel like balance is too affected. Has questions about his shoulder precautions .   Pt accompanied by: significant other  PERTINENT HISTORY: anxiety, GERD   PAIN:  Are you having pain? No  PRECAUTIONS: Back; R scapular fx in sling at all times  WEIGHT BEARING RESTRICTIONS No  FALLS: Has patient fallen in last 6 months? Yes. Number of falls 1  LIVING ENVIRONMENT: Lives with: lives with their spouse Lives in: House/apartment Stairs:  4 steps to enter; 14 steps to basement Has following equipment at home: Shower bench  and Grab bars  PLOF: Independent  PATIENT GOALS improve dizziness   OBJECTIVE:      TODAY'S TREATMENT: 10/12/21 Activity Comments  R roll  negative   L roll Negative   R DH C/o back pain, thus time for testing limited   L DH C/o back pain, thus time for testing limited but otherwise negative; c/o mild dizziness upon sitting up   R brandt daroff  Upbeating torsional nystagmus lasting 15 sec  L brandt daroff  Negative       M-CTSIB  Condition 1: Firm Surface, EO 30 Sec, Normal Sway  Condition 2: Firm Surface, EC 30 Sec, Mild Sway  Condition 3: Foam Surface, EO 30 Sec, Mild Sway  Condition 4: Foam Surface, EC 30 Sec, Moderate Sway      [facility] PT Assessment - 10/12/21 0001       Standardized Balance Assessment   Standardized Balance Assessment Dynamic Gait Index      Dynamic Gait Index   Level Surface Normal    Change in Gait Speed Normal    Gait with Horizontal Head Turns Normal    Gait with Vertical Head Turns Normal    Gait and Pivot Turn Normal    Step Over Obstacle Normal    Step Around Obstacles Normal    Steps Normal    Total Score 24              HOME EXERCISE PROGRAM   Last updated: 10/12/21 Access Code: [email or web address] URL: [email or web address] Date: 10/12/2021 Prepared by: [facility]  Exercises - Romberg Stance Eyes Closed on Foam Pad  - 1 x daily - 5 x weekly - 2-3 sets - 30 sec hold   PATIENT EDUCATION: Education details: encouraged to continue using sling and to avoid shoulder ROM; edu on exam findings; discussed plan to hold off on vestibular rehab at this time d/t severity of back pain with bed mobility, HEP Person educated: Patient Education method: Explanation, Demonstration, Tactile cues, and Verbal cues Education comprehension: verbalized understanding    Below measures were taken at time of initial evaluation unless otherwise specified:   DIAGNOSTIC FINDINGS: 09/20/21 head CT: 1.  Multifocal traumatic intracranial hemorrhage: mild progression of anterior bifrontal hemorrhagic contusions. No associated mass effect. small volume bilateral subarachnoid hemorrhage not significantly changed. only minimal left side subdural hematoma visible now by CT. No significant intracranial mass effect. No IVH or ventriculomegaly. Nondisplaced right side skull fracture tracking from the foramen magnum toward the vertex. Right scalp hematoma.  No new intracranial abnormality.  COGNITION: Overall cognitive status:  pt and wife report aggravation and hyperfixation on aspects of his current situation/injury.    SENSATION: Reports N/T in R most medial fingers   POSTURE: rounded shoulders  GAIT: Gait pattern: decreased step length- Right and decreased step length- Left Assistive device utilized: None Level of assistance: Modified independence Comments: slow and guarded gait   PATIENT SURVEYS:  FOTO [phone]   VESTIBULAR ASSESSMENT   GENERAL OBSERVATION: R shoulder in sling, dressing over the R posterior head; patient is wearing bifocals which he wears most of the time       OCULOMOTOR EXAM:   Ocular Alignment: normal   Ocular ROM: No Limitations   Spontaneous Nystagmus: absent   Gaze-Induced Nystagmus: absent   Smooth Pursuits: intact   Saccades: hypermetric/overshoots and very slight overshooting in inferior direction   Convergence/Divergence: 1 cm    VESTIBULAR - OCULAR REFLEX:    Slow VOR: Normal    VOR Cancellation: Normal   Head-Impulse Test: HIT Right: negative HIT Left: negative    POSITIONAL TESTING:  *nonfatiguing downbeating nystagmus upon laying supine Right Roll Test: downbeating R torsional vs. L upbeating torsional nystagmus; Duration: 1.5 min Left Roll Test: R upbeating torsional nystagmus; Duration: pt unable to tolerate full duration of test d/t c/o nausea     VESTIBULAR TREATMENT:   PATIENT EDUCATION: Education details: prognosis, POC, edu on BPPV  and received verbal consent to message Dr. Alvan Dame concerning R shoulder precautions to allow for CRM Person educated: Patient and Spouse Education method: Explanation, Demonstration, Tactile cues, and Verbal cues Education comprehension: verbalized understanding   GOALS: Goals reviewed with patient? Yes  SHORT TERM GOALS: Target date: 10/24/2021  Patient to be independent with initial HEP. Baseline: HEP initiated Goal status: IN PROGRESS    LONG TERM GOALS: Target date: 11/14/2021  Patient to be independent with advanced HEP. Baseline: Not yet initiated  Goal status: IN PROGRESS  Patient will report 0/10 dizziness with bed mobility.  Baseline: Symptomatic  Goal status: IN PROGRESS  Patient to demonstrate mild sway with M-CTSIB condition with eyes closed/foam surface in order to improve safety in environments with uneven surfaces and dim lighting. Baseline: NT Goal status: IN PROGRESS  Patient to score at least 20/24 on DGI in order to decrease risk of falls. Baseline: 24 Goal status: MET 10/12/21  Patient to score at least 59 on FOTO  in order to indicate improved functional outcomes.  Baseline: 52 Goal status: IN PROGRESS   ASSESSMENT:  CLINICAL IMPRESSION:  Patient arrived to session with report of improvement in dizziness. Patient scored 24/24 on DGI, indicating decreased risk of falls. Increased difficulty using vestibular system was evident with M-CTSIB. Positional testing was completed without movement modification d/t Dr. Olin's clearance for rolling sidelying to patient's tolerance. Patient with suspected R posterior canalithiasis but with limited tolerance for bed mobility d/t c/o LBP despite use of back brace. At this time d/t low severity of sx, will hold off on CRM until pain levels are a bit lower. Patient in agreement and without complaints at end of session.    OBJECTIVE IMPAIRMENTS Abnormal gait, decreased activity tolerance, decreased balance, dizziness, and  postural dysfunction.   ACTIVITY LIMITATIONS carrying, lifting, bending, sitting, standing, squatting, sleeping, stairs, transfers, bed mobility, bathing, dressing, and reach over head  PARTICIPATION LIMITATIONS: meal prep, cleaning, laundry, driving, shopping, community activity, yard work, and church  PERSONAL FACTORS Age, Behavior pattern, Past/current experiences, Time since onset of injury/illness/exacerbation, and 1-2 comorbidities: anxiety, GERD  are also affecting patient's functional outcome.   REHAB POTENTIAL: Good  CLINICAL DECISION MAKING: Evolving/moderate complexity  EVALUATION COMPLEXITY: Moderate   PLAN: PT FREQUENCY: 1-2x/week  PT DURATION: 6 weeks  PLANNED INTERVENTIONS: Therapeutic exercises, Therapeutic activity, Neuromuscular re-education, Balance training, Gait training, Patient/Family education, Self Care, Joint mobilization, Stair training, Vestibular training, Canalith repositioning, Aquatic Therapy, Dry Needling, Electrical stimulation, Cryotherapy, Moist heat, Taping, Manual therapy, and Re-evaluation  PLAN FOR NEXT SESSION: R Epley as tolerated    Yevgeniya Kovalenko, PT, DPT 10/12/21 4:13 PM  [facility] [address], [address] [place], [place] [address] Phone # [phone] Fax # [phone]  

## 2021-10-12 ENCOUNTER — Encounter: Payer: Self-pay | Admitting: Physical Therapy

## 2021-10-12 ENCOUNTER — Ambulatory Visit: Payer: Managed Care, Other (non HMO) | Attending: Neurosurgery | Admitting: Physical Therapy

## 2021-10-12 DIAGNOSIS — H8113 Benign paroxysmal vertigo, bilateral: Secondary | ICD-10-CM | POA: Insufficient documentation

## 2021-10-12 DIAGNOSIS — R42 Dizziness and giddiness: Secondary | ICD-10-CM | POA: Insufficient documentation

## 2021-10-12 DIAGNOSIS — R2681 Unsteadiness on feet: Secondary | ICD-10-CM | POA: Insufficient documentation

## 2021-10-12 DIAGNOSIS — R29818 Other symptoms and signs involving the nervous system: Secondary | ICD-10-CM | POA: Diagnosis present

## 2021-10-20 ENCOUNTER — Other Ambulatory Visit: Payer: Self-pay | Admitting: Orthopedic Surgery

## 2021-10-20 DIAGNOSIS — M25511 Pain in right shoulder: Secondary | ICD-10-CM

## 2021-10-31 ENCOUNTER — Ambulatory Visit
Admission: RE | Admit: 2021-10-31 | Discharge: 2021-10-31 | Disposition: A | Discharge status: Home / Self Care | Payer: Managed Care, Other (non HMO) | Source: Ambulatory Visit | Attending: Orthopedic Surgery | Admitting: Orthopedic Surgery

## 2021-10-31 DIAGNOSIS — M25511 Pain in right shoulder: Secondary | ICD-10-CM

## 2021-11-08 NOTE — Therapy (Signed)
OUTPATIENT PHYSICAL THERAPY UPPER EXTREMITY EVALUATION   Patient Name: Keith Schroeder MRN: [id number] DOB:Jun 20, 1960, 61 y.o., male Today's Date: 11/09/2021   PT End of Session - 11/09/21 1316     Visit Number 1    Date for PT Re-Evaluation 01/04/22    Authorization Type [employer]    PT Start Time 1232    PT Stop Time 1315    PT Time Calculation (min) 43 min    Activity Tolerance Patient tolerated treatment well    Behavior During Therapy WFL for tasks assessed/performed             Past Medical History:  Diagnosis Date   Anxiety    GERD (gastroesophageal reflux disease)    Past Surgical History:  Procedure Laterality Date   CHOLECYSTECTOMY     Patient Active Problem List   Diagnosis Date Noted   TBI (traumatic brain injury) ([place]) 09/19/2021    PCP: Shirline Frees, MD   REFERRING PROVIDER: Esmond Plants, MD  REFERRING DIAG: [phone] (ICD-10-CM) - Displaced fracture of coracoid process, right shoulder, initial encounter for closed fracture  THERAPY DIAG:  Chronic right shoulder pain - Plan: PT plan of care cert/re-cert  Stiffness of right shoulder, not elsewhere classified - Plan: PT plan of care cert/re-cert  Muscle weakness (generalized) - Plan: PT plan of care cert/re-cert  Abnormal posture - Plan: PT plan of care cert/re-cert  Rationale for Evaluation and Treatment Rehabilitation  ONSET DATE: 09/19/21  SUBJECTIVE:                                                                                                                                                                                      SUBJECTIVE STATEMENT: Pt reports to PT with Rt scapular fracture sustained 09/19/21 when he fell through a floor of his attic.  Pt also sustained a skull fracture and L5 compression fracture. Pt had a 1 week stay in the hospital.  Pt sustained a contusion to frontal lobe.  Pt was in a sling after injury and now has instructions to not raise arm >90 degrees.     PERTINENT HISTORY: TBI, anxiety   PAIN:  Are you having pain? Yes: NPRS scale: 5-6/10 with use of Rt UE Pain location: Rt arm, diffuse around shoulder  Pain description: sore/stiffness  Aggravating factors: use, trying to lift arm Relieving factors: rest  PRECAUTIONS: Other: per MD: gentle therapy, below shoulder only (<90 degrees), yellow theraband ok.    WEIGHT BEARING RESTRICTIONS No  FALLS:  Has patient fallen in last 6 months? No No loss of balance- fell in attic when balancing on truss  LIVING ENVIRONMENT: Lives with: lives with their spouse  Lives in: House/apartment Stairs:  4 steps to enter; 14 steps to basement Has following equipment at home: Shower bench and Grab bars   OCCUPATION: Retired   PLOF: [place] return to normal use of Rt UE, improve ROM and strength of Rt UE  OBJECTIVE:  DIAGNOSTIC FINDINGS:  MRI:  Early healing changes involving the fracture through the base of the acromion. Some early areas of bony ingrowth but no solid osseous bridging or significant callus formation. 2. No other fractures are identified. 3. Grossly by CT the rotator cuff tendons are intact.  PATIENT SURVEYS:  FOTO 60 (goal is 51)  COGNITION:  Overall cognitive status: Within functional limits for tasks assessed     SENSATION: WFL  POSTURE: Forward head and rounded shoulder   UPPER EXTREMITY ROM:   Active ROM Right eval Left eval  Shoulder flexion 65 (90 PROM) Full throughout  Shoulder extension full   Shoulder abduction 80 (90 PROM)   Shoulder adduction    Shoulder internal rotation NT   Shoulder external rotation NT   Elbow flexion full   Elbow extension    Wrist flexion    Wrist extension    Wrist ulnar deviation    Wrist radial deviation    Wrist pronation    Wrist supination    (Blank rows = not tested)  UPPER EXTREMITY MMT:  MMT Right eval Left eval  Shoulder flexion Submax testing in neutral: 4+/5 5/5 throughout   Shoulder extension Submax testing in neutral: 4+/5   Shoulder abduction    Shoulder adduction    Shoulder internal rotation    Shoulder external rotation    Middle trapezius    Lower trapezius    Elbow flexion 4+/5   Elbow extension    Wrist flexion    Wrist extension    Wrist ulnar deviation    Wrist radial deviation    Wrist pronation    Wrist supination    Grip strength (lbs)    (Blank rows = not tested)   PALPATION:  Palpable tenderness over Rt anterior glenohumeral joint.  Tension in Rt pecs, tender at axilla, anterior deltoid.  Limited joint mobility in all directions with significant guarding   TODAY'S TREATMENT:  Date: 11/09/21 HEP established-see below   PATIENT EDUCATION: Education details: Access Code: [email or web address] Person educated: Patient Education method: Explanation, Demonstration, and Handouts Education comprehension: verbalized understanding and returned demonstration   HOME EXERCISE PROGRAM: Access Code: [email or web address] URL: [email or web address] Date: 11/09/2021 Prepared by: Claiborne Billings  Exercises - Supine Shoulder Press with Dowel  - 2 x daily - 7 x weekly - 2 sets - 10 reps - Supine Shoulder Flexion with Dowel  - 1 x daily - 7 x weekly - 2 sets - 10 reps - 5-10 hold - Shoulder Extension with Resistance  - 1 x daily - 7 x weekly - 2 sets - 10 reps - Seated Scapular Retraction  - 5 x daily - 7 x weekly - 1 sets - 10 reps - Seated Single Arm Bicep Curls Supinated with Dumbbell  - 1 x daily - 7 x weekly - 2 sets - 10 reps  ASSESSMENT:  CLINICAL IMPRESSION: Patient is a Rt hand dominant 62 y.o. male who was seen today for physical therapy evaluation and treatment for Rt shoulder fracture at coracoid process (MRI states distal acromion).  Pt sustained a fall through his ceiling 09/19/21 and fractured Rt coracoid fracture, L5, and skull fracture posteriorly.  MD has allowed gentle therapy below the  shoulder (<90).  Pt with limited Rt shoulder A/ROM against  gravity with lateral humerus pain with flexion and abduction.  Palpable tenderness over Rt pecs, proximal biceps and anterior deltoid.  Patient will benefit from skilled PT to address the below impairments and improve overall function.    OBJECTIVE IMPAIRMENTS decreased activity tolerance, decreased mobility, decreased ROM, decreased strength, increased muscle spasms, impaired flexibility, impaired UE functional use, postural dysfunction, and pain.   ACTIVITY LIMITATIONS carrying, lifting, bathing, dressing, and hygiene/grooming  PARTICIPATION LIMITATIONS: meal prep, cleaning, laundry, and yard work  PERSONAL FACTORS Time since onset of injury/illness/exacerbation and 1-2 comorbidities: Rt scapular fracture, vertigo/balance  are also affecting patient's functional outcome.   REHAB POTENTIAL: Good  CLINICAL DECISION MAKING: Stable/uncomplicated  EVALUATION COMPLEXITY: Low   GOALS: Goals reviewed with patient? Yes  SHORT TERM GOALS: Target date: 12/07/2021   Be independent in initial HEP Baseline: Goal status: INITIAL  2.  Demonstrate Rt shoulder A/ROM flexion to > or = to 80 degrees to improve self-care Baseline: 65 Goal status: INITIAL  3.  Report > or = to 70% use of Rt UE with tasks below 90 degrees  Baseline:  Goal status: INITIAL    LONG TERM GOALS: Target date: 01/04/2022   Be independent in advanced HEP Baseline:  Goal status: INITIAL  2.  Improve FOTO to > or = to 65  Baseline: 44 Goal status: INITIAL  3.  Demonstrate 90 degrees of Rt shoulder A/ROM flexion to improve self-care Baseline:  Goal status: INITIAL  4.  Demonstrate neutral posture to promote scapular healing and allow for return to restored Rt UE A/ROM Baseline:  Goal status: INITIAL  5.  New goals will be set once MD restrictions are lifted  Baseline:  Goal status: INITIAL   PLAN: PT FREQUENCY: 1-2x/week  PT DURATION: 8 weeks  PLANNED INTERVENTIONS: Therapeutic exercises, Therapeutic  activity, Neuromuscular re-education, Patient/Family education, Self Care, Joint mobilization, Aquatic Therapy, Dry Needling, Electrical stimulation, Cryotherapy, Moist heat, Taping, Ultrasound, Manual therapy, and Re-evaluation  PLAN FOR NEXT SESSION: review HEP, gentle ROM and strength to Rt UE  Sigurd Sos, PT 11/09/21 1:27 PM   [facility] [address], [place], [place] [address] Phone # [phone] Fax [phone]

## 2021-11-09 ENCOUNTER — Ambulatory Visit: Payer: Managed Care, Other (non HMO) | Attending: Orthopedic Surgery

## 2021-11-09 ENCOUNTER — Other Ambulatory Visit: Payer: Self-pay

## 2021-11-09 DIAGNOSIS — M6281 Muscle weakness (generalized): Secondary | ICD-10-CM

## 2021-11-09 DIAGNOSIS — S42131A Displaced fracture of coracoid process, right shoulder, initial encounter for closed fracture: Secondary | ICD-10-CM | POA: Diagnosis present

## 2021-11-09 DIAGNOSIS — R293 Abnormal posture: Secondary | ICD-10-CM

## 2021-11-09 DIAGNOSIS — G8929 Other chronic pain: Secondary | ICD-10-CM

## 2021-11-09 DIAGNOSIS — M25611 Stiffness of right shoulder, not elsewhere classified: Secondary | ICD-10-CM

## 2021-11-11 ENCOUNTER — Ambulatory Visit: Payer: Managed Care, Other (non HMO)

## 2021-11-11 DIAGNOSIS — R2681 Unsteadiness on feet: Secondary | ICD-10-CM

## 2021-11-11 DIAGNOSIS — R42 Dizziness and giddiness: Secondary | ICD-10-CM

## 2021-11-11 DIAGNOSIS — G8929 Other chronic pain: Secondary | ICD-10-CM

## 2021-11-11 DIAGNOSIS — H8113 Benign paroxysmal vertigo, bilateral: Secondary | ICD-10-CM

## 2021-11-11 DIAGNOSIS — S42131A Displaced fracture of coracoid process, right shoulder, initial encounter for closed fracture: Secondary | ICD-10-CM | POA: Diagnosis not present

## 2021-11-11 DIAGNOSIS — M6281 Muscle weakness (generalized): Secondary | ICD-10-CM

## 2021-11-11 DIAGNOSIS — M25611 Stiffness of right shoulder, not elsewhere classified: Secondary | ICD-10-CM

## 2021-11-11 DIAGNOSIS — R29818 Other symptoms and signs involving the nervous system: Secondary | ICD-10-CM

## 2021-11-11 DIAGNOSIS — R293 Abnormal posture: Secondary | ICD-10-CM

## 2021-11-11 NOTE — Therapy (Signed)
OUTPATIENT PHYSICAL THERAPY UPPER EXTREMITY TREATMENT NOTE   Patient Name: Keith Schroeder MRN: [id number] DOB:27-Jul-1960, 61 y.o., male Today's Date: 11/11/2021   PT End of Session - 11/11/21 1025     Visit Number 2    Number of Visits 13    Date for PT Re-Evaluation 01/04/22    Authorization Type [employer]    PT Start Time 1017    PT Stop Time 1100    PT Time Calculation (min) 43 min    Equipment Utilized During Treatment Back brace;Other (comment)    Activity Tolerance Patient tolerated treatment well    Behavior During Therapy WFL for tasks assessed/performed             Past Medical History:  Diagnosis Date   Anxiety    GERD (gastroesophageal reflux disease)    Past Surgical History:  Procedure Laterality Date   CHOLECYSTECTOMY     Patient Active Problem List   Diagnosis Date Noted   TBI (traumatic brain injury) (HCC) 09/19/2021    PCP: Johny Blamer, MD   REFERRING PROVIDER: Malon Kindle, MD  REFERRING DIAG: [phone] (ICD-10-CM) - Displaced fracture of coracoid process, right shoulder, initial encounter for closed fracture  THERAPY DIAG:  Chronic right shoulder pain  Stiffness of right shoulder, not elsewhere classified  Abnormal posture  BPPV (benign paroxysmal positional vertigo), bilateral  Dizziness and giddiness  Other symptoms and signs involving the nervous system  Muscle weakness (generalized)  Unsteadiness on feet  Rationale for Evaluation and Treatment Rehabilitation  ONSET DATE: 09/19/21  SUBJECTIVE:                                                                                                                                                                                      SUBJECTIVE STATEMENT: Patient reports he is doing well.  He doesn't have pain at rest, only when he tries to raise his arm out to side or to the front.    Initial eval: Pt reports to PT with Rt scapular fracture sustained 09/19/21 when he fell through a  floor of his attic.  Pt also sustained a skull fracture and L5 compression fracture. Pt had a 1 week stay in the hospital.  Pt sustained a contusion to frontal lobe.  Pt was in a sling after injury and now has instructions to not raise arm >90 degrees.    PERTINENT HISTORY: TBI, anxiety   PAIN:  Are you having pain? Yes: NPRS scale: 5-6/10 with use of Rt UE Pain location: Rt arm, diffuse around shoulder  Pain description: sore/stiffness  Aggravating factors: use, trying to lift arm Relieving factors: rest  PRECAUTIONS: Other: per MD: gentle  therapy, below shoulder only (<90 degrees), yellow theraband ok.    WEIGHT BEARING RESTRICTIONS No  FALLS:  Has patient fallen in last 6 months? No No loss of balance- fell in attic when balancing on truss  LIVING ENVIRONMENT: Lives with: lives with their spouse Lives in: House/apartment Stairs:  4 steps to enter; 14 steps to basement Has following equipment at home: Tour manager and Grab bars   OCCUPATION: Retired   PLOF: Independent  PATIENT GOALS return to normal use of Rt UE, improve ROM and strength of Rt UE  OBJECTIVE:  DIAGNOSTIC FINDINGS:  MRI:  Early healing changes involving the fracture through the base of the acromion. Some early areas of bony ingrowth but no solid osseous bridging or significant callus formation. 2. No other fractures are identified. 3. Grossly by CT the rotator cuff tendons are intact.  PATIENT SURVEYS:  FOTO 44 (goal is 69)  COGNITION:  Overall cognitive status: Within functional limits for tasks assessed     SENSATION: WFL  POSTURE: Forward head and rounded shoulder   UPPER EXTREMITY ROM:   Active ROM Right eval Left eval  Shoulder flexion 65 (90 PROM) Full throughout  Shoulder extension full   Shoulder abduction 80 (90 PROM)   Shoulder adduction    Shoulder internal rotation NT   Shoulder external rotation NT   Elbow flexion full   Elbow extension    Wrist flexion    Wrist  extension    Wrist ulnar deviation    Wrist radial deviation    Wrist pronation    Wrist supination    (Blank rows = not tested)  UPPER EXTREMITY MMT:  MMT Right eval Left eval  Shoulder flexion Submax testing in neutral: 4+/5 5/5 throughout  Shoulder extension Submax testing in neutral: 4+/5   Shoulder abduction    Shoulder adduction    Shoulder internal rotation    Shoulder external rotation    Middle trapezius    Lower trapezius    Elbow flexion 4+/5   Elbow extension    Wrist flexion    Wrist extension    Wrist ulnar deviation    Wrist radial deviation    Wrist pronation    Wrist supination    Grip strength (lbs)    (Blank rows = not tested)   PALPATION:  Palpable tenderness over Rt anterior glenohumeral joint.  Tension in Rt pecs, tender at axilla, anterior deltoid.  Limited joint mobility in all directions with significant guarding   TODAY'S TREATMENT:  Date: 11/11/21 Finger ladder x 10 flexion and abduction below shoulder level Standing gentle low resistance shoulder extension, row, IR, ER with yellow tband (with handle) Attempted ball rolling on wall but patient had pain Supine chest press with blue trigger point cane x 10 Supine shoulder ER AAROM with blue trigger point cane x 10 PROM right shoulder flexion to 90, abduction to 90, IR , ER ( verbal cues for reducing guarding)  Date: 11/09/21 HEP established-see below   PATIENT EDUCATION: Education details: Access Code: [email or web address] Person educated: Patient Education method: Explanation, Demonstration, and Handouts Education comprehension: verbalized understanding and returned demonstration   HOME EXERCISE PROGRAM: Access Code: [email or web address] URL: [email or web address] Date: 11/09/2021 Prepared by: Tresa Endo  Exercises - Supine Shoulder Press with Dowel  - 2 x daily - 7 x weekly - 2 sets - 10 reps - Supine Shoulder Flexion with Dowel  - 1 x daily - 7 x weekly - 2 sets - 10 reps - 5-10 hold -  Shoulder  Extension with Resistance  - 1 x daily - 7 x weekly - 2 sets - 10 reps - Seated Scapular Retraction  - 5 x daily - 7 x weekly - 1 sets - 10 reps - Seated Single Arm Bicep Curls Supinated with Dumbbell  - 1 x daily - 7 x weekly - 2 sets - 10 reps  ASSESSMENT:  CLINICAL IMPRESSION: Patient is a Rt hand dominant 61 y.o. male who was seen today for physical therapy evaluation and treatment for Rt shoulder fracture at coracoid process (MRI states distal acromion).  Pt sustained a fall through his ceiling 09/19/21 and fractured Rt coracoid fracture, L5, and skull fracture posteriorly.  MD has allowed gentle therapy below the shoulder (<90).  Pt with limited Rt shoulder A/ROM against gravity with lateral humerus pain with flexion and abduction.  Palpable tenderness over Rt pecs, proximal biceps and anterior deltoid.  Patient will benefit from skilled PT to address the below impairments and improve overall function.    OBJECTIVE IMPAIRMENTS decreased activity tolerance, decreased mobility, decreased ROM, decreased strength, increased muscle spasms, impaired flexibility, impaired UE functional use, postural dysfunction, and pain.   ACTIVITY LIMITATIONS carrying, lifting, bathing, dressing, and hygiene/grooming  PARTICIPATION LIMITATIONS: meal prep, cleaning, laundry, and yard work  PERSONAL FACTORS Time since onset of injury/illness/exacerbation and 1-2 comorbidities: Rt scapular fracture, vertigo/balance  are also affecting patient's functional outcome.   REHAB POTENTIAL: Good  CLINICAL DECISION MAKING: Stable/uncomplicated  EVALUATION COMPLEXITY: Low   GOALS: Goals reviewed with patient? Yes  SHORT TERM GOALS: Target date: 12/07/2021   Be independent in initial HEP Baseline: Goal status: INITIAL  2.  Demonstrate Rt shoulder A/ROM flexion to > or = to 80 degrees to improve self-care Baseline: 65 Goal status: INITIAL  3.  Report > or = to 70% use of Rt UE with tasks below 90  degrees  Baseline:  Goal status: INITIAL    LONG TERM GOALS: Target date: 01/04/2022   Be independent in advanced HEP Baseline:  Goal status: INITIAL  2.  Improve FOTO to > or = to 65  Baseline: 44 Goal status: INITIAL  3.  Demonstrate 90 degrees of Rt shoulder A/ROM flexion to improve self-care Baseline:  Goal status: INITIAL  4.  Demonstrate neutral posture to promote scapular healing and allow for return to restored Rt UE A/ROM Baseline:  Goal status: INITIAL  5.  New goals will be set once MD restrictions are lifted  Baseline:  Goal status: INITIAL   PLAN: PT FREQUENCY: 1-2x/week  PT DURATION: 8 weeks  PLANNED INTERVENTIONS: Therapeutic exercises, Therapeutic activity, Neuromuscular re-education, Patient/Family education, Self Care, Joint mobilization, Aquatic Therapy, Dry Needling, Electrical stimulation, Cryotherapy, Moist heat, Taping, Ultrasound, Manual therapy, and Re-evaluation  PLAN FOR NEXT SESSION: review HEP, gentle ROM and strength to Rt UE  Sigurd Sos, PT 11/11/21 11:56 AM   [facility] [address] [place], [place] [address] Phone # [phone] Fax [phone]

## 2021-11-16 ENCOUNTER — Ambulatory Visit: Payer: Managed Care, Other (non HMO)

## 2021-11-16 DIAGNOSIS — H8113 Benign paroxysmal vertigo, bilateral: Secondary | ICD-10-CM

## 2021-11-16 DIAGNOSIS — M25611 Stiffness of right shoulder, not elsewhere classified: Secondary | ICD-10-CM

## 2021-11-16 DIAGNOSIS — R42 Dizziness and giddiness: Secondary | ICD-10-CM

## 2021-11-16 DIAGNOSIS — S42131A Displaced fracture of coracoid process, right shoulder, initial encounter for closed fracture: Secondary | ICD-10-CM | POA: Diagnosis not present

## 2021-11-16 DIAGNOSIS — R293 Abnormal posture: Secondary | ICD-10-CM

## 2021-11-16 DIAGNOSIS — G8929 Other chronic pain: Secondary | ICD-10-CM

## 2021-11-16 DIAGNOSIS — M6281 Muscle weakness (generalized): Secondary | ICD-10-CM

## 2021-11-16 NOTE — Therapy (Signed)
OUTPATIENT PHYSICAL THERAPY UPPER EXTREMITY TREATMENT NOTE   Patient Name: Keith Schroeder MRN: [id number] DOB:Dec 10, 1960, 61 y.o., male Today's Date: 11/16/2021   PT End of Session - 11/16/21 1107     Visit Number 3    Number of Visits 13    Date for PT Re-Evaluation 01/04/22    Authorization Type [employer]    PT Start Time 1108    PT Stop Time 1146    PT Time Calculation (min) 38 min    Equipment Utilized During Treatment Back brace;Other (comment)    Activity Tolerance Patient tolerated treatment well             Past Medical History:  Diagnosis Date   Anxiety    GERD (gastroesophageal reflux disease)    Past Surgical History:  Procedure Laterality Date   CHOLECYSTECTOMY     Patient Active Problem List   Diagnosis Date Noted   TBI (traumatic brain injury) (HCC) 09/19/2021    PCP: Johny Blamer, MD   REFERRING PROVIDER: Malon Kindle, MD  REFERRING DIAG: [phone] (ICD-10-CM) - Displaced fracture of coracoid process, right shoulder, initial encounter for closed fracture  THERAPY DIAG:  Chronic right shoulder pain  Stiffness of right shoulder, not elsewhere classified  Abnormal posture  BPPV (benign paroxysmal positional vertigo), bilateral  Dizziness and giddiness  Muscle weakness (generalized)  Rationale for Evaluation and Treatment Rehabilitation  ONSET DATE: 09/19/21  SUBJECTIVE:                                                                                                                                                                                      SUBJECTIVE STATEMENT: Patient reports he is doing well.  He doesn't have pain.  "Just a little tension in my low back"    Initial eval: Pt reports to PT with Rt scapular fracture sustained 09/19/21 when he fell through a floor of his attic.  Pt also sustained a skull fracture and L5 compression fracture. Pt had a 1 week stay in the hospital.  Pt sustained a contusion to frontal lobe.  Pt was in a  sling after injury and now has instructions to not raise arm >90 degrees.    PERTINENT HISTORY: TBI, anxiety   PAIN:  Are you having pain? Yes: NPRS scale: 0/10  Pain location: Rt arm, diffuse around shoulder  Pain description: sore/stiffness  Aggravating factors: use, trying to lift arm Relieving factors: rest  PRECAUTIONS: Other: per MD: gentle therapy, below shoulder only (<90 degrees), yellow theraband ok.    WEIGHT BEARING RESTRICTIONS No  FALLS:  Has patient fallen in last 6 months? No No loss of balance- fell in attic when  balancing on truss  LIVING ENVIRONMENT: Lives with: lives with their spouse Lives in: House/apartment Stairs:  4 steps to enter; 14 steps to basement Has following equipment at home: Tour manager and Grab bars   OCCUPATION: Retired   PLOF: Independent  PATIENT GOALS return to normal use of Rt UE, improve ROM and strength of Rt UE  OBJECTIVE:  DIAGNOSTIC FINDINGS:  MRI:  Early healing changes involving the fracture through the base of the acromion. Some early areas of bony ingrowth but no solid osseous bridging or significant callus formation. 2. No other fractures are identified. 3. Grossly by CT the rotator cuff tendons are intact.  PATIENT SURVEYS:  FOTO 44 (goal is 79)  COGNITION:  Overall cognitive status: Within functional limits for tasks assessed     SENSATION: WFL  POSTURE: Forward head and rounded shoulder   UPPER EXTREMITY ROM:   Active ROM Right eval Left eval  Shoulder flexion 65 (90 PROM) Full throughout  Shoulder extension full   Shoulder abduction 80 (90 PROM)   Shoulder adduction    Shoulder internal rotation NT   Shoulder external rotation NT   Elbow flexion full   Elbow extension    Wrist flexion    Wrist extension    Wrist ulnar deviation    Wrist radial deviation    Wrist pronation    Wrist supination    (Blank rows = not tested)  UPPER EXTREMITY MMT:  MMT Right eval Left eval  Shoulder  flexion Submax testing in neutral: 4+/5 5/5 throughout  Shoulder extension Submax testing in neutral: 4+/5   Shoulder abduction    Shoulder adduction    Shoulder internal rotation    Shoulder external rotation    Middle trapezius    Lower trapezius    Elbow flexion 4+/5   Elbow extension    Wrist flexion    Wrist extension    Wrist ulnar deviation    Wrist radial deviation    Wrist pronation    Wrist supination    Grip strength (lbs)    (Blank rows = not tested)   PALPATION:  Palpable tenderness over Rt anterior glenohumeral joint.  Tension in Rt pecs, tender at axilla, anterior deltoid.  Limited joint mobility in all directions with significant guarding   TODAY'S TREATMENT:  Date: 11/16/21 UBE standing: level 1, 2.5 min fwd and 2.5 min reverse 4 D oscillations with BOSU pole below shoulder level (BOSU pole angled in chair) Finger ladder x 10 flexion and abduction below shoulder level Standing gentle low resistance shoulder extension, row, IR, ER with yellow tband (with handle) Supine chest press with blue trigger point cane x 10 Supine shoulder ER AAROM with blue trigger point cane x 10 PROM right shoulder flexion to 90, abduction to 90, IR , ER ( verbal cues for reducing guarding)  Date: 11/11/21 Finger ladder x 10 flexion and abduction below shoulder level Standing gentle low resistance shoulder extension, row, IR, ER with yellow tband (with handle) Attempted ball rolling on wall but patient had pain Supine chest press with blue trigger point cane x 10 Supine shoulder ER AAROM with blue trigger point cane x 10 PROM right shoulder flexion to 90, abduction to 90, IR , ER ( verbal cues for reducing guarding)  Date: 11/09/21 HEP established-see below   PATIENT EDUCATION: Education details: Access Code: [email or web address] Person educated: Patient Education method: Explanation, Demonstration, and Handouts Education comprehension: verbalized understanding and returned  demonstration   HOME EXERCISE PROGRAM: Access Code: [email or web address]  URL: [email or web address] Date: 11/09/2021 Prepared by: Claiborne Billings  Exercises - Supine Shoulder Press with Dowel  - 2 x daily - 7 x weekly - 2 sets - 10 reps - Supine Shoulder Flexion with Dowel  - 1 x daily - 7 x weekly - 2 sets - 10 reps - 5-10 hold - Shoulder Extension with Resistance  - 1 x daily - 7 x weekly - 2 sets - 10 reps - Seated Scapular Retraction  - 5 x daily - 7 x weekly - 1 sets - 10 reps - Seated Single Arm Bicep Curls Supinated with Dumbbell  - 1 x daily - 7 x weekly - 2 sets - 10 reps  ASSESSMENT:  CLINICAL IMPRESSION: Patient is progressing appropriately with the current protocol restrictions.  He is compliant and well motivated.  He should continue to do well and would benefit from continued skilled PT to restore function of the right UE.     OBJECTIVE IMPAIRMENTS decreased activity tolerance, decreased mobility, decreased ROM, decreased strength, increased muscle spasms, impaired flexibility, impaired UE functional use, postural dysfunction, and pain.   ACTIVITY LIMITATIONS carrying, lifting, bathing, dressing, and hygiene/grooming  PARTICIPATION LIMITATIONS: meal prep, cleaning, laundry, and yard work  PERSONAL FACTORS Time since onset of injury/illness/exacerbation and 1-2 comorbidities: Rt scapular fracture, vertigo/balance  are also affecting patient's functional outcome.   REHAB POTENTIAL: Good  CLINICAL DECISION MAKING: Stable/uncomplicated  EVALUATION COMPLEXITY: Low   GOALS: Goals reviewed with patient? Yes  SHORT TERM GOALS: Target date: 12/07/2021   Be independent in initial HEP Baseline: Goal status: INITIAL  2.  Demonstrate Rt shoulder A/ROM flexion to > or = to 80 degrees to improve self-care Baseline: 65 Goal status: INITIAL  3.  Report > or = to 70% use of Rt UE with tasks below 90 degrees  Baseline:  Goal status: INITIAL    LONG TERM GOALS: Target date:  01/04/2022   Be independent in advanced HEP Baseline:  Goal status: INITIAL  2.  Improve FOTO to > or = to 65  Baseline: 44 Goal status: INITIAL  3.  Demonstrate 90 degrees of Rt shoulder A/ROM flexion to improve self-care Baseline:  Goal status: INITIAL  4.  Demonstrate neutral posture to promote scapular healing and allow for return to restored Rt UE A/ROM Baseline:  Goal status: INITIAL  5.  New goals will be set once MD restrictions are lifted  Baseline:  Goal status: INITIAL   PLAN: PT FREQUENCY: 1-2x/week  PT DURATION: 8 weeks  PLANNED INTERVENTIONS: Therapeutic exercises, Therapeutic activity, Neuromuscular re-education, Patient/Family education, Self Care, Joint mobilization, Aquatic Therapy, Dry Needling, Electrical stimulation, Cryotherapy, Moist heat, Taping, Ultrasound, Manual therapy, and Re-evaluation  PLAN FOR NEXT SESSION: review HEP, gentle ROM and strength to Rt UE  Garvey Westcott B. Terrion Gencarelli, PT 11/16/21 11:48 AM   [facility] [address] [place], [place] [address] Phone # [phone] Fax [phone]

## 2021-11-22 ENCOUNTER — Ambulatory Visit: Payer: Managed Care, Other (non HMO)

## 2021-11-22 DIAGNOSIS — M25611 Stiffness of right shoulder, not elsewhere classified: Secondary | ICD-10-CM

## 2021-11-22 DIAGNOSIS — R293 Abnormal posture: Secondary | ICD-10-CM

## 2021-11-22 DIAGNOSIS — G8929 Other chronic pain: Secondary | ICD-10-CM

## 2021-11-22 DIAGNOSIS — S42131A Displaced fracture of coracoid process, right shoulder, initial encounter for closed fracture: Secondary | ICD-10-CM | POA: Diagnosis not present

## 2021-11-22 NOTE — Therapy (Signed)
OUTPATIENT PHYSICAL THERAPY UPPER EXTREMITY TREATMENT NOTE   Patient Name: Keith Schroeder MRN: [id number] DOB:1960/06/11, 61 y.o., male Today's Date: 11/22/2021   PT End of Session - 11/22/21 1221     Visit Number 4    Date for PT Re-Evaluation 01/04/22    Authorization Type [employer]    PT Start Time 1148    PT Stop Time 1223    PT Time Calculation (min) 35 min    Activity Tolerance Patient tolerated treatment well              Past Medical History:  Diagnosis Date   Anxiety    GERD (gastroesophageal reflux disease)    Past Surgical History:  Procedure Laterality Date   CHOLECYSTECTOMY     Patient Active Problem List   Diagnosis Date Noted   TBI (traumatic brain injury) ([place]) 09/19/2021    PCP: Shirline Frees, MD   REFERRING PROVIDER: Esmond Plants, MD  REFERRING DIAG: [phone] (ICD-10-CM) - Displaced fracture of coracoid process, right shoulder, initial encounter for closed fracture  THERAPY DIAG:  Chronic right shoulder pain  Stiffness of right shoulder, not elsewhere classified  Abnormal posture  Rationale for Evaluation and Treatment Rehabilitation  ONSET DATE: 09/19/21  SUBJECTIVE:                                                                                                                                                                                      SUBJECTIVE STATEMENT: It is interesting what hurts my arm and what doesn't .   I feel pretty weak in my Rt arm.   I am using my arm as much as I can but still not allowed to lift > 90 degrees.  Initial eval: Pt reports to PT with Rt scapular fracture sustained 09/19/21 when he fell through a floor of his attic.  Pt also sustained a skull fracture and L5 compression fracture. Pt had a 1 week stay in the hospital.  Pt sustained a contusion to frontal lobe.  Pt was in a sling after injury and now has instructions to not raise arm >90 degrees.    PERTINENT HISTORY: TBI, anxiety   PAIN:  Are you  having pain? Yes: NPRS scale: 0/10  Pain location: Rt arm, diffuse around shoulder  Pain description: sore/stiffness  Aggravating factors: use, trying to lift arm Relieving factors: rest  PRECAUTIONS: Other: per MD: gentle therapy, below shoulder only (<90 degrees), yellow theraband ok.    WEIGHT BEARING RESTRICTIONS No  FALLS:  Has patient fallen in last 6 months? No No loss of balance- fell in attic when balancing on truss  LIVING ENVIRONMENT: Lives with: lives with their  spouse Lives in: House/apartment Stairs:  4 steps to enter; 14 steps to basement Has following equipment at home: Electronics engineer and Grab bars   OCCUPATION: Retired   PLOF: [place] return to normal use of Rt UE, improve ROM and strength of Rt UE  OBJECTIVE:  DIAGNOSTIC FINDINGS:  MRI:  Early healing changes involving the fracture through the base of the acromion. Some early areas of bony ingrowth but no solid osseous bridging or significant callus formation. 2. No other fractures are identified. 3. Grossly by CT the rotator cuff tendons are intact.  PATIENT SURVEYS:  FOTO 51 (goal is 102)  COGNITION:  Overall cognitive status: Within functional limits for tasks assessed     SENSATION: WFL  POSTURE: Forward head and rounded shoulder   UPPER EXTREMITY ROM:   Active ROM Right eval Right  11/22/21 Left eval  Shoulder flexion 65 (90 PROM) 88 A/ROM Full throughout  Shoulder extension full    Shoulder abduction 80 (90 PROM)    Shoulder adduction     Shoulder internal rotation NT    Shoulder external rotation NT    Elbow flexion full    Elbow extension     Wrist flexion     Wrist extension     Wrist ulnar deviation     Wrist radial deviation     Wrist pronation     Wrist supination     (Blank rows = not tested)  UPPER EXTREMITY MMT:  MMT Right eval Left eval  Shoulder flexion Submax testing in neutral: 4+/5 5/5 throughout  Shoulder extension Submax testing in  neutral: 4+/5   Shoulder abduction    Shoulder adduction    Shoulder internal rotation    Shoulder external rotation    Middle trapezius    Lower trapezius    Elbow flexion 4+/5   Elbow extension    Wrist flexion    Wrist extension    Wrist ulnar deviation    Wrist radial deviation    Wrist pronation    Wrist supination    Grip strength (lbs)    (Blank rows = not tested)   PALPATION:  Palpable tenderness over Rt anterior glenohumeral joint.  Tension in Rt pecs, tender at axilla, anterior deltoid.  Limited joint mobility in all directions with significant guarding   TODAY'S TREATMENT:  Date: 11/22/21 UBE standing: level 1, 2.5 min fwd and 2.5 min reverse 4 D oscillations with BOSU pole below shoulder level (BOSU pole angled in chair) Finger ladder x 10 flexion and abduction below shoulder level Seated shoulder flexion and scaption to 90 degrees Rt 2x5 Standing gentle low resistance shoulder extension, row, IR, ER with yellow tband (with handle) Ball rolls at wall at ~80 degrees flexion 1x10 bil CW and CCW   Date: 11/16/21 UBE standing: level 1, 2.5 min fwd and 2.5 min reverse 4 D oscillations with BOSU pole below shoulder level (BOSU pole angled in chair) Finger ladder x 10 flexion and abduction below shoulder level Standing gentle low resistance shoulder extension, row, IR, ER with yellow tband (with handle) Supine chest press with blue trigger point cane x 10 Supine shoulder ER AAROM with blue trigger point cane x 10 PROM right shoulder flexion to 90, abduction to 90, IR , ER ( verbal cues for reducing guarding)  Date: 11/11/21 Finger ladder x 10 flexion and abduction below shoulder level Standing gentle low resistance shoulder extension, row, IR, ER with yellow tband (with handle) Attempted ball rolling on wall  but patient had pain Supine chest press with blue trigger point cane x 10 Supine shoulder ER AAROM with blue trigger point cane x 10 PROM right shoulder flexion  to 90, abduction to 90, IR , ER ( verbal cues for reducing guarding)  Date: 11/09/21 HEP established-see below   PATIENT EDUCATION: Education details: Access Code: [email or web address] Person educated: Patient Education method: Explanation, Demonstration, and Handouts Education comprehension: verbalized understanding and returned demonstration   HOME EXERCISE PROGRAM: Access Code: [email or web address] URL: [email or web address] Date: 11/22/2021 Prepared by: Claiborne Billings  Exercises - Supine Shoulder Press with Dowel  - 2 x daily - 7 x weekly - 2 sets - 10 reps - Supine Shoulder Flexion with Dowel  - 1 x daily - 7 x weekly - 2 sets - 10 reps - 5-10 hold - Shoulder Extension with Resistance  - 1 x daily - 7 x weekly - 2 sets - 10 reps - Seated Scapular Retraction  - 5 x daily - 7 x weekly - 1 sets - 10 reps - Seated Single Arm Bicep Curls Supinated with Dumbbell  - 1 x daily - 7 x weekly - 2 sets - 10 reps - Seated Shoulder Flexion  - 2 x daily - 7 x weekly - 2 sets - 5 reps - Standing Shoulder Scaption  - 2 x daily - 7 x weekly - 2 sets - 5 reps ASSESSMENT:  CLINICAL IMPRESSION: Patient is progressing appropriately with the current protocol restrictions.  Pt reports that he is using his Rt arm as much as allowed and keeping it below 90 degrees.  He is eating and doing other light tasks with the Rt UE.  PT added exercises to promote shoulder flexion to 90 degrees to improve functional use.  A/ROM of Rt shoulder is improved to 88 degrees of flexion. Pt had fatigue and mild pain with ball rolls on the wall. PT provided tactile cues for scapular position and alignment throughout.  Patient will benefit from skilled PT to address the below impairments and improve overall function.   OBJECTIVE IMPAIRMENTS decreased activity tolerance, decreased mobility, decreased ROM, decreased strength, increased muscle spasms, impaired flexibility, impaired UE functional use, postural dysfunction, and pain.   ACTIVITY  LIMITATIONS carrying, lifting, bathing, dressing, and hygiene/grooming  PARTICIPATION LIMITATIONS: meal prep, cleaning, laundry, and yard work  PERSONAL FACTORS Time since onset of injury/illness/exacerbation and 1-2 comorbidities: Rt scapular fracture, vertigo/balance  are also affecting patient's functional outcome.   REHAB POTENTIAL: Good  CLINICAL DECISION MAKING: Stable/uncomplicated  EVALUATION COMPLEXITY: Low   GOALS: Goals reviewed with patient? Yes  SHORT TERM GOALS: Target date: 12/07/2021   Be independent in initial HEP Baseline: Goal status: MET (11/22/21)  2.  Demonstrate Rt shoulder A/ROM flexion to > or = to 80 degrees to improve self-care Baseline: 65 Goal status: INITIAL  3.  Report > or = to 70% use of Rt UE with tasks below 90 degrees  Baseline: using below 90 degrees and for eating and other light tasks.  ~50% (11/22/21) Goal status: in progress    LONG TERM GOALS: Target date: 01/04/2022   Be independent in advanced HEP Baseline:  Goal status: INITIAL  2.  Improve FOTO to > or = to 65  Baseline: 44 Goal status: INITIAL  3.  Demonstrate 90 degrees of Rt shoulder A/ROM flexion to improve self-care Baseline:  Goal status: INITIAL  4.  Demonstrate neutral posture to promote scapular healing and allow for return to restored Rt UE A/ROM Baseline:  Goal status:  INITIAL  5.  New goals will be set once MD restrictions are lifted  Baseline:  Goal status: INITIAL   PLAN: PT FREQUENCY: 1-2x/week  PT DURATION: 8 weeks  PLANNED INTERVENTIONS: Therapeutic exercises, Therapeutic activity, Neuromuscular re-education, Patient/Family education, Self Care, Joint mobilization, Aquatic Therapy, Dry Needling, Electrical stimulation, Cryotherapy, Moist heat, Taping, Ultrasound, Manual therapy, and Re-evaluation  PLAN FOR NEXT SESSION: review HEP, gentle ROM and strength to Rt UE, follow MD orders   Sigurd Sos, PT 11/22/21 12:31 PM   [facility] [address], [place], [place] [address] Phone # [phone] Fax [phone]

## 2021-11-24 ENCOUNTER — Ambulatory Visit: Payer: Managed Care, Other (non HMO)

## 2021-11-24 DIAGNOSIS — R29818 Other symptoms and signs involving the nervous system: Secondary | ICD-10-CM

## 2021-11-24 DIAGNOSIS — M6281 Muscle weakness (generalized): Secondary | ICD-10-CM

## 2021-11-24 DIAGNOSIS — M25611 Stiffness of right shoulder, not elsewhere classified: Secondary | ICD-10-CM

## 2021-11-24 DIAGNOSIS — R2681 Unsteadiness on feet: Secondary | ICD-10-CM

## 2021-11-24 DIAGNOSIS — R42 Dizziness and giddiness: Secondary | ICD-10-CM

## 2021-11-24 DIAGNOSIS — R293 Abnormal posture: Secondary | ICD-10-CM

## 2021-11-24 DIAGNOSIS — H8113 Benign paroxysmal vertigo, bilateral: Secondary | ICD-10-CM

## 2021-11-24 DIAGNOSIS — S42131A Displaced fracture of coracoid process, right shoulder, initial encounter for closed fracture: Secondary | ICD-10-CM | POA: Diagnosis not present

## 2021-11-24 DIAGNOSIS — G8929 Other chronic pain: Secondary | ICD-10-CM

## 2021-11-24 NOTE — Therapy (Signed)
OUTPATIENT PHYSICAL THERAPY UPPER EXTREMITY TREATMENT NOTE   Patient Name: Keith Schroeder MRN: [id number] DOB:03-Mar-1960, 61 y.o., male Today's Date: 11/24/2021   PT End of Session - 11/24/21 1238     Visit Number 5    Number of Visits 13    Date for PT Re-Evaluation 01/04/22    Authorization Type [employer]    PT Start Time 1233    PT Stop Time 1308    PT Time Calculation (min) 35 min    Activity Tolerance Patient tolerated treatment well    Behavior During Therapy WFL for tasks assessed/performed              Past Medical History:  Diagnosis Date   Anxiety    GERD (gastroesophageal reflux disease)    Past Surgical History:  Procedure Laterality Date   CHOLECYSTECTOMY     Patient Active Problem List   Diagnosis Date Noted   TBI (traumatic brain injury) ([place]) 09/19/2021    PCP: Shirline Frees, MD   REFERRING PROVIDER: Esmond Plants, MD  REFERRING DIAG: [phone] (ICD-10-CM) - Displaced fracture of coracoid process, right shoulder, initial encounter for closed fracture  THERAPY DIAG:  Chronic right shoulder pain  Stiffness of right shoulder, not elsewhere classified  Abnormal posture  BPPV (benign paroxysmal positional vertigo), bilateral  Dizziness and giddiness  Muscle weakness (generalized)  Unsteadiness on feet  Other symptoms and signs involving the nervous system  Rationale for Evaluation and Treatment Rehabilitation  ONSET DATE: 09/19/21  SUBJECTIVE:                                                                                                                                                                                      SUBJECTIVE STATEMENT: Patient reports doing ok.  No new pain or soreness.    Initial eval: Pt reports to PT with Rt scapular fracture sustained 09/19/21 when he fell through a floor of his attic.  Pt also sustained a skull fracture and L5 compression fracture. Pt had a 1 week stay in the hospital.  Pt sustained a  contusion to frontal lobe.  Pt was in a sling after injury and now has instructions to not raise arm >90 degrees.    PERTINENT HISTORY: TBI, anxiety   PAIN:  Are you having pain? Yes: NPRS scale: 0/10  Pain location: Rt arm, diffuse around shoulder  Pain description: sore/stiffness  Aggravating factors: use, trying to lift arm Relieving factors: rest  PRECAUTIONS: Other: per MD: gentle therapy, below shoulder only (<90 degrees), yellow theraband ok.    WEIGHT BEARING RESTRICTIONS No  FALLS:  Has patient fallen in last 6 months? No No loss of balance-  fell in attic when balancing on truss  LIVING ENVIRONMENT: Lives with: lives with their spouse Lives in: House/apartment Stairs:  4 steps to enter; 14 steps to basement Has following equipment at home: Electronics engineer and Grab bars   OCCUPATION: Retired   PLOF: Independent  PATIENT GOALS return to normal use of Rt UE, improve ROM and strength of Rt UE  OBJECTIVE:  DIAGNOSTIC FINDINGS:  MRI:  Early healing changes involving the fracture through the base of the acromion. Some early areas of bony ingrowth but no solid osseous bridging or significant callus formation. 2. No other fractures are identified. 3. Grossly by CT the rotator cuff tendons are intact.  PATIENT SURVEYS:  FOTO 59 (goal is 18)  COGNITION:  Overall cognitive status: Within functional limits for tasks assessed     SENSATION: WFL  POSTURE: Forward head and rounded shoulder   UPPER EXTREMITY ROM:   Active ROM Right eval Right  11/22/21 Left eval  Shoulder flexion 65 (90 PROM) 88 A/ROM Full throughout  Shoulder extension full    Shoulder abduction 80 (90 PROM)    Shoulder adduction     Shoulder internal rotation NT    Shoulder external rotation NT    Elbow flexion full    Elbow extension     Wrist flexion     Wrist extension     Wrist ulnar deviation     Wrist radial deviation     Wrist pronation     Wrist supination     (Blank rows = not  tested)  UPPER EXTREMITY MMT:  MMT Right eval Left eval  Shoulder flexion Submax testing in neutral: 4+/5 5/5 throughout  Shoulder extension Submax testing in neutral: 4+/5   Shoulder abduction    Shoulder adduction    Shoulder internal rotation    Shoulder external rotation    Middle trapezius    Lower trapezius    Elbow flexion 4+/5   Elbow extension    Wrist flexion    Wrist extension    Wrist ulnar deviation    Wrist radial deviation    Wrist pronation    Wrist supination    Grip strength (lbs)    (Blank rows = not tested)   PALPATION:  Palpable tenderness over Rt anterior glenohumeral joint.  Tension in Rt pecs, tender at axilla, anterior deltoid.  Limited joint mobility in all directions with significant guarding   TODAY'S TREATMENT:  Date: 11/24/21 UBE standing: level 1, 2.5 min fwd and 2.5 min reverse 4 D oscillations with BOSU pole below shoulder level (BOSU pole angled in chair) Finger ladder x 10 flexion and abduction below shoulder level Seated shoulder flexion and scaption to 90 degrees Rt 2 x 10 Isometrics x 5 each: flexion, abduction, ER, IR hold 5 sec each in doorway Standing gentle low resistance shoulder extension, row, IR, ER with yellow tband (with handle) 2x10 PROM shoulder flex, abd (below 90) and ER/IR x 5 min  Date: 11/22/21 UBE standing: level 1, 2.5 min fwd and 2.5 min reverse 4 D oscillations with BOSU pole below shoulder level (BOSU pole angled in chair) Finger ladder x 10 flexion and abduction below shoulder level Seated shoulder flexion and scaption to 90 degrees Rt 2x5 Standing gentle low resistance shoulder extension, row, IR, ER with yellow tband (with handle) Ball rolls at wall at ~80 degrees flexion 1x10 bil CW and CCW   Date: 11/22/21 UBE standing: level 1, 2.5 min fwd and 2.5 min reverse 4 D oscillations with  BOSU pole below shoulder level (BOSU pole angled in chair) Finger ladder x 10 flexion and abduction below shoulder  level Seated shoulder flexion and scaption to 90 degrees Rt 2x5 Standing gentle low resistance shoulder extension, row, IR, ER with yellow tband (with handle) Ball rolls at wall at ~80 degrees flexion 1x10 bil CW and CCW    PATIENT EDUCATION: Education details: Access Code: [email or web address] Person educated: Patient Education method: Explanation, Demonstration, and Handouts Education comprehension: verbalized understanding and returned demonstration   HOME EXERCISE PROGRAM: Access Code: [email or web address] URL: [email or web address] Date: 11/22/2021 Prepared by: Claiborne Billings  Exercises - Supine Shoulder Press with Dowel  - 2 x daily - 7 x weekly - 2 sets - 10 reps - Supine Shoulder Flexion with Dowel  - 1 x daily - 7 x weekly - 2 sets - 10 reps - 5-10 hold - Shoulder Extension with Resistance  - 1 x daily - 7 x weekly - 2 sets - 10 reps - Seated Scapular Retraction  - 5 x daily - 7 x weekly - 1 sets - 10 reps - Seated Single Arm Bicep Curls Supinated with Dumbbell  - 1 x daily - 7 x weekly - 2 sets - 10 reps - Seated Shoulder Flexion  - 2 x daily - 7 x weekly - 2 sets - 5 reps - Standing Shoulder Scaption  - 2 x daily - 7 x weekly - 2 sets - 5 reps ASSESSMENT:  CLINICAL IMPRESSION: Herchel is tolerating all exercises with minimal pain.  ROM is at acceptable ranges for current protocol restrictions.  End feel is painful likely due to adhesions forming with ROM and overhead use restrictions.   Patient will benefit from skilled PT to address the below impairments and improve overall function.   OBJECTIVE IMPAIRMENTS decreased activity tolerance, decreased mobility, decreased ROM, decreased strength, increased muscle spasms, impaired flexibility, impaired UE functional use, postural dysfunction, and pain.   ACTIVITY LIMITATIONS carrying, lifting, bathing, dressing, and hygiene/grooming  PARTICIPATION LIMITATIONS: meal prep, cleaning, laundry, and yard work  PERSONAL FACTORS Time since onset of  injury/illness/exacerbation and 1-2 comorbidities: Rt scapular fracture, vertigo/balance  are also affecting patient's functional outcome.   REHAB POTENTIAL: Good  CLINICAL DECISION MAKING: Stable/uncomplicated  EVALUATION COMPLEXITY: Low   GOALS: Goals reviewed with patient? Yes  SHORT TERM GOALS: Target date: 12/07/2021   Be independent in initial HEP Baseline: Goal status: MET (11/22/21)  2.  Demonstrate Rt shoulder A/ROM flexion to > or = to 80 degrees to improve self-care Baseline: 65 Goal status: INITIAL  3.  Report > or = to 70% use of Rt UE with tasks below 90 degrees  Baseline: using below 90 degrees and for eating and other light tasks.  ~50% (11/22/21) Goal status: in progress    LONG TERM GOALS: Target date: 01/04/2022   Be independent in advanced HEP Baseline:  Goal status: INITIAL  2.  Improve FOTO to > or = to 65  Baseline: 44 Goal status: INITIAL  3.  Demonstrate 90 degrees of Rt shoulder A/ROM flexion to improve self-care Baseline:  Goal status: INITIAL  4.  Demonstrate neutral posture to promote scapular healing and allow for return to restored Rt UE A/ROM Baseline:  Goal status: INITIAL  5.  New goals will be set once MD restrictions are lifted  Baseline:  Goal status: INITIAL   PLAN: PT FREQUENCY: 1-2x/week  PT DURATION: 8 weeks  PLANNED INTERVENTIONS: Therapeutic exercises, Therapeutic activity, Neuromuscular re-education, Patient/Family education, Self Care, Joint mobilization, Aquatic  Therapy, Dry Needling, Electrical stimulation, Cryotherapy, Moist heat, Taping, Ultrasound, Manual therapy, and Re-evaluation  PLAN FOR NEXT SESSION:  gentle ROM and strength to Rt UE, follow MD orders   Anderson Malta B. Stanly Si, PT 11/24/21 1:14 PM   [facility] [address], [place] [address] [place], [place] [address] Phone # [phone] Fax [phone]

## 2021-11-29 ENCOUNTER — Ambulatory Visit: Payer: Managed Care, Other (non HMO)

## 2021-11-29 DIAGNOSIS — R293 Abnormal posture: Secondary | ICD-10-CM

## 2021-11-29 DIAGNOSIS — M25611 Stiffness of right shoulder, not elsewhere classified: Secondary | ICD-10-CM

## 2021-11-29 DIAGNOSIS — G8929 Other chronic pain: Secondary | ICD-10-CM

## 2021-11-29 DIAGNOSIS — S42131A Displaced fracture of coracoid process, right shoulder, initial encounter for closed fracture: Secondary | ICD-10-CM | POA: Diagnosis not present

## 2021-11-29 NOTE — Therapy (Signed)
OUTPATIENT PHYSICAL THERAPY UPPER EXTREMITY TREATMENT NOTE   Patient Name: Keith Schroeder MRN: [id number] DOB:[birth year], 61 y.o., male Today's Date: 11/29/2021   PT End of Session - 11/29/21 1318     Visit Number 6    Date for PT Re-Evaluation 01/04/22    Authorization Type [employer]    PT Start Time 1237    PT Stop Time 1314    PT Time Calculation (min) 37 min    Activity Tolerance Patient tolerated treatment well    Behavior During Therapy WFL for tasks assessed/performed               Past Medical History:  Diagnosis Date   Anxiety    GERD (gastroesophageal reflux disease)    Past Surgical History:  Procedure Laterality Date   CHOLECYSTECTOMY     Patient Active Problem List   Diagnosis Date Noted   TBI (traumatic brain injury) ([place]) 09/19/2021    PCP: Shirline Frees, MD   REFERRING PROVIDER: Esmond Plants, MD  REFERRING DIAG: [phone] (ICD-10-CM) - Displaced fracture of coracoid process, right shoulder, initial encounter for closed fracture  THERAPY DIAG:  Chronic right shoulder pain  Stiffness of right shoulder, not elsewhere classified  Abnormal posture  Rationale for Evaluation and Treatment Rehabilitation  ONSET DATE: 09/19/21  SUBJECTIVE:                                                                                                                                                                                      SUBJECTIVE STATEMENT: Patient reports doing ok.  No new pain or soreness.    Initial eval: Pt reports to PT with Rt scapular fracture sustained 09/19/21 when he fell through a floor of his attic.  Pt also sustained a skull fracture and L5 compression fracture. Pt had a 1 week stay in the hospital.  Pt sustained a contusion to frontal lobe.  Pt was in a sling after injury and now has instructions to not raise arm >90 degrees.    PERTINENT HISTORY: TBI, anxiety   PAIN:  Are you having pain? Yes: NPRS scale: 0/10  Pain location: Rt  arm, diffuse around shoulder  Pain description: sore/stiffness  Aggravating factors: use, trying to lift arm Relieving factors: rest  PRECAUTIONS: Other: per MD: gentle therapy, below shoulder only (<90 degrees), yellow theraband ok.    WEIGHT BEARING RESTRICTIONS No  FALLS:  Has patient fallen in last 6 months? No No loss of balance- fell in attic when balancing on truss  LIVING ENVIRONMENT: Lives with: lives with their spouse Lives in: House/apartment Stairs:  4 steps to enter; 14 steps to basement Has following equipment at home:  Shower bench and Grab bars   OCCUPATION: Retired   PLOF: Independent  PATIENT GOALS return to normal use of Rt UE, improve ROM and strength of Rt UE  OBJECTIVE:  DIAGNOSTIC FINDINGS:  MRI:  Early healing changes involving the fracture through the base of the acromion. Some early areas of bony ingrowth but no solid osseous bridging or significant callus formation. 2. No other fractures are identified. 3. Grossly by CT the rotator cuff tendons are intact.  PATIENT SURVEYS:  FOTO 22 (goal is 75)  COGNITION:  Overall cognitive status: Within functional limits for tasks assessed     SENSATION: WFL  POSTURE: Forward head and rounded shoulder   UPPER EXTREMITY ROM:   Active ROM Right eval Right  11/22/21 Left eval  Shoulder flexion 65 (90 PROM) 88 A/ROM Full throughout  Shoulder extension full    Shoulder abduction 80 (90 PROM)    Shoulder adduction     Shoulder internal rotation NT    Shoulder external rotation NT    Elbow flexion full    Elbow extension     Wrist flexion     Wrist extension     Wrist ulnar deviation     Wrist radial deviation     Wrist pronation     Wrist supination     (Blank rows = not tested)  UPPER EXTREMITY MMT:  MMT Right eval Left eval  Shoulder flexion Submax testing in neutral: 4+/5 5/5 throughout  Shoulder extension Submax testing in neutral: 4+/5   Shoulder abduction    Shoulder adduction     Shoulder internal rotation    Shoulder external rotation    Middle trapezius    Lower trapezius    Elbow flexion 4+/5   Elbow extension    Wrist flexion    Wrist extension    Wrist ulnar deviation    Wrist radial deviation    Wrist pronation    Wrist supination    Grip strength (lbs)    (Blank rows = not tested)   PALPATION:  Palpable tenderness over Rt anterior glenohumeral joint.  Tension in Rt pecs, tender at axilla, anterior deltoid.  Limited joint mobility in all directions with significant guarding   TODAY'S TREATMENT:  Date: 11/29/21 UBE standing: level 1.5 x 6 min (3/3)- PT present to discuss progress  4 D oscillations with UE Ranger on wall x 10 each Finger ladder x 10 flexion and abduction below shoulder level Seated shoulder flexion and scaption to 90 degrees Rt 2 x 10 Supine theraband: horizontal abduction and D2 yellow 2x10 bil each PROM shoulder flex, abd (below 90) and ER/IR x 5 min  Date: 11/24/21 UBE standing: level 1, 2.5 min fwd and 2.5 min reverse 4 D oscillations with BOSU pole below shoulder level (BOSU pole angled in chair) Finger ladder x 10 flexion and abduction below shoulder level Seated shoulder flexion and scaption to 90 degrees Rt 2 x 10 Isometrics x 5 each: flexion, abduction, ER, IR hold 5 sec each in doorway Standing gentle low resistance shoulder extension, row, IR, ER with yellow tband (with handle) 2x10 PROM shoulder flex, abd (below 90) and ER/IR x 5 min  Date: 11/22/21 UBE standing: level 1, 2.5 min fwd and 2.5 min reverse 4 D oscillations with BOSU pole below shoulder level (BOSU pole angled in chair) Finger ladder x 10 flexion and abduction below shoulder level Seated shoulder flexion and scaption to 90 degrees Rt 2x5 Standing gentle low resistance shoulder extension, row, IR, ER  with yellow tband (with handle) Ball rolls at wall at ~80 degrees flexion 1x10 bil CW and CCW   PATIENT EDUCATION: Education details: Access Code:  [email or web address] Person educated: Patient Education method: Explanation, Demonstration, and Handouts Education comprehension: verbalized understanding and returned demonstration   HOME EXERCISE PROGRAM: Access Code: [email or web address] URL: [email or web address] Date: 11/22/2021 Prepared by: Claiborne Billings  Exercises - Supine Shoulder Press with Dowel  - 2 x daily - 7 x weekly - 2 sets - 10 reps - Supine Shoulder Flexion with Dowel  - 1 x daily - 7 x weekly - 2 sets - 10 reps - 5-10 hold - Shoulder Extension with Resistance  - 1 x daily - 7 x weekly - 2 sets - 10 reps - Seated Scapular Retraction  - 5 x daily - 7 x weekly - 1 sets - 10 reps - Seated Single Arm Bicep Curls Supinated with Dumbbell  - 1 x daily - 7 x weekly - 2 sets - 10 reps - Seated Shoulder Flexion  - 2 x daily - 7 x weekly - 2 sets - 5 reps - Standing Shoulder Scaption  - 2 x daily - 7 x weekly - 2 sets - 5 reps ASSESSMENT:  CLINICAL IMPRESSION: Pt is doing well with all exercises within MD restrctions.  ROM is at acceptable ranges for current protocol restrictions.  Pt does experience pain at end range of limits against gravity.  PT monitored throughout session for pain and technique.  Pt is challenged with oscillations on the wall with ranger due to strength deficits.    Patient will benefit from skilled PT to address the below impairments and improve overall function.   OBJECTIVE IMPAIRMENTS decreased activity tolerance, decreased mobility, decreased ROM, decreased strength, increased muscle spasms, impaired flexibility, impaired UE functional use, postural dysfunction, and pain.   ACTIVITY LIMITATIONS carrying, lifting, bathing, dressing, and hygiene/grooming  PARTICIPATION LIMITATIONS: meal prep, cleaning, laundry, and yard work  PERSONAL FACTORS Time since onset of injury/illness/exacerbation and 1-2 comorbidities: Rt scapular fracture, vertigo/balance  are also affecting patient's functional outcome.   REHAB POTENTIAL:  Good  CLINICAL DECISION MAKING: Stable/uncomplicated  EVALUATION COMPLEXITY: Low   GOALS: Goals reviewed with patient? Yes  SHORT TERM GOALS: Target date: 12/07/2021   Be independent in initial HEP Baseline: Goal status: MET (11/22/21)  2.  Demonstrate Rt shoulder A/ROM flexion to > or = to 80 degrees to improve self-care Baseline: 65 Goal status: INITIAL  3.  Report > or = to 70% use of Rt UE with tasks below 90 degrees  Baseline: using below 90 degrees and for eating and other light tasks.  ~50% (11/22/21) Goal status: in progress    LONG TERM GOALS: Target date: 01/04/2022   Be independent in advanced HEP Baseline:  Goal status: INITIAL  2.  Improve FOTO to > or = to 65  Baseline: 44 Goal status: INITIAL  3.  Demonstrate 90 degrees of Rt shoulder A/ROM flexion to improve self-care Baseline:  Goal status: INITIAL  4.  Demonstrate neutral posture to promote scapular healing and allow for return to restored Rt UE A/ROM Baseline:  Goal status: INITIAL  5.  New goals will be set once MD restrictions are lifted  Baseline:  Goal status: INITIAL   PLAN: PT FREQUENCY: 1-2x/week  PT DURATION: 8 weeks  PLANNED INTERVENTIONS: Therapeutic exercises, Therapeutic activity, Neuromuscular re-education, Patient/Family education, Self Care, Joint mobilization, Aquatic Therapy, Dry Needling, Electrical stimulation, Cryotherapy, Moist heat, Taping, Ultrasound, Manual therapy, and Re-evaluation  PLAN FOR  NEXT SESSION:  gentle ROM and strength to Rt UE, Pt sees MD on 12/07/21  Sigurd Sos, PT 11/29/21 1:19 PM   [facility] [address] [place], [place] [address] Phone # [phone] Fax [phone]

## 2021-12-06 ENCOUNTER — Ambulatory Visit: Payer: Managed Care, Other (non HMO)

## 2021-12-06 DIAGNOSIS — R293 Abnormal posture: Secondary | ICD-10-CM

## 2021-12-06 DIAGNOSIS — M25611 Stiffness of right shoulder, not elsewhere classified: Secondary | ICD-10-CM

## 2021-12-06 DIAGNOSIS — M6281 Muscle weakness (generalized): Secondary | ICD-10-CM

## 2021-12-06 DIAGNOSIS — S42131A Displaced fracture of coracoid process, right shoulder, initial encounter for closed fracture: Secondary | ICD-10-CM | POA: Diagnosis not present

## 2021-12-06 DIAGNOSIS — G8929 Other chronic pain: Secondary | ICD-10-CM

## 2021-12-06 NOTE — Therapy (Addendum)
OUTPATIENT PHYSICAL THERAPY UPPER EXTREMITY TREATMENT NOTE   Patient Name: Keith Schroeder MRN: [id number] DOB:[birth year], 61 y.o., male Today's Date: 12/06/2021   PT End of Session - 12/06/21 1143     Visit Number 7    Date for PT Re-Evaluation 01/04/22    Authorization Type [employer]    PT Start Time 1101    PT Stop Time 9937    PT Time Calculation (min) 44 min    Activity Tolerance Patient tolerated treatment well    Behavior During Therapy WFL for tasks assessed/performed                Past Medical History:  Diagnosis Date   Anxiety    GERD (gastroesophageal reflux disease)    Past Surgical History:  Procedure Laterality Date   CHOLECYSTECTOMY     Patient Active Problem List   Diagnosis Date Noted   TBI (traumatic brain injury) ([place]) 09/19/2021    PCP: Shirline Frees, MD   REFERRING PROVIDER: Esmond Plants, MD  REFERRING DIAG: [phone] (ICD-10-CM) - Displaced fracture of coracoid process, right shoulder, initial encounter for closed fracture  THERAPY DIAG:  Chronic right shoulder pain  Stiffness of right shoulder, not elsewhere classified  Abnormal posture  Muscle weakness (generalized)  Rationale for Evaluation and Treatment Rehabilitation  ONSET DATE: 09/19/21  SUBJECTIVE:                                                                                                                                                                                      SUBJECTIVE STATEMENT: I'll see the MD 12/08/21.  My arm is feeling good and I'm using it more.   Initial eval: Pt reports to PT with Rt scapular fracture sustained 09/19/21 when he fell through a floor of his attic.  Pt also sustained a skull fracture and L5 compression fracture. Pt had a 1 week stay in the hospital.  Pt sustained a contusion to frontal lobe.  Pt was in a sling after injury and now has instructions to not raise arm >90 degrees.    PERTINENT HISTORY: TBI, anxiety   PAIN:  Are you  having pain? Yes: NPRS scale: 0/10 up to 3/10 with use against gravity.  Pain location: Rt arm, diffuse around shoulder  Pain description: sore/stiffness  Aggravating factors: use, trying to lift arm Relieving factors: rest  PRECAUTIONS: Other: per MD: gentle therapy, below shoulder only (<90 degrees), yellow theraband ok.    WEIGHT BEARING RESTRICTIONS No  FALLS:  Has patient fallen in last 6 months? No No loss of balance- fell in attic when balancing on truss  LIVING ENVIRONMENT: Lives with: lives with their spouse Lives  in: House/apartment Stairs:  4 steps to enter; 14 steps to basement Has following equipment at home: Electronics engineer and Grab bars   OCCUPATION: Retired   PLOF: [place] return to normal use of Rt UE, improve ROM and strength of Rt UE  OBJECTIVE:  DIAGNOSTIC FINDINGS:  MRI:  Early healing changes involving the fracture through the base of the acromion. Some early areas of bony ingrowth but no solid osseous bridging or significant callus formation. 2. No other fractures are identified. 3. Grossly by CT the rotator cuff tendons are intact.  PATIENT SURVEYS:  FOTO 44 (goal is 60) 12/06/21: 64 (goal is 39)  COGNITION:  Overall cognitive status: Within functional limits for tasks assessed     SENSATION: WFL  POSTURE: Forward head and rounded shoulder   UPPER EXTREMITY ROM:   Active ROM Right eval Right  11/22/21 Right 12/06/21 Left eval  Shoulder flexion 65 (90 PROM) 88 A/ROM 90 with ease Full throughout  Shoulder extension full     Shoulder abduction 80 (90 PROM)  90 with ease    Shoulder adduction      Shoulder internal rotation NT     Shoulder external rotation NT     Elbow flexion full     Elbow extension      Wrist flexion      Wrist extension      Wrist ulnar deviation      Wrist radial deviation      Wrist pronation      Wrist supination      (Blank rows = not tested)  UPPER EXTREMITY MMT:  MMT Right eval  Left eval  Shoulder flexion Submax testing in neutral: 4+/5 5/5 throughout  Shoulder extension Submax testing in neutral: 4+/5   Shoulder abduction    Shoulder adduction    Shoulder internal rotation    Shoulder external rotation    Middle trapezius    Lower trapezius    Elbow flexion 4+/5   Elbow extension    Wrist flexion    Wrist extension    Wrist ulnar deviation    Wrist radial deviation    Wrist pronation    Wrist supination    Grip strength (lbs)    (Blank rows = not tested)   PALPATION:  Palpable tenderness over Rt anterior glenohumeral joint.  Tension in Rt pecs, tender at axilla, anterior deltoid.  Limited joint mobility in all directions with significant guarding   TODAY'S TREATMENT:  Date: 12/06/21 UBE standing: level 1.5 x 6 min (3/3)- PT present to discuss progress  Supine: yellow theraband 2x10 Finger ladder x 10 flexion and abduction 1# added below shoulder level Seated shoulder flexion and scaption to 90 degrees Rt 2 x10.  1# added  ER with yellow band in sitting 2x10 Supine: narrow grip chest press 2# 2x10 Supine shoulder flexion to 90 degrees 2# 2x10 Side lying shoulder flexion- no weight 2x10   Date: 11/29/21 UBE standing: level 1.5 x 6 min (3/3)- PT present to discuss progress  4 D oscillations with UE Ranger on wall x 10 each Finger ladder x 10 flexion and abduction below shoulder level Seated shoulder flexion and scaption to 90 degrees Rt 2 x 10 Supine theraband: horizontal abduction and D2 yellow 2x10 bil each PROM shoulder flex, abd (below 90) and ER/IR x 5 min  Date: 11/24/21 UBE standing: level 1, 2.5 min fwd and 2.5 min reverse 4 D oscillations with BOSU pole below shoulder level (BOSU pole angled  in chair) Finger ladder x 10 flexion and abduction below shoulder level Seated shoulder flexion and scaption to 90 degrees Rt 2 x 10 Isometrics x 5 each: flexion, abduction, ER, IR hold 5 sec each in doorway Standing gentle low resistance  shoulder extension, row, IR, ER with yellow tband (with handle) 2x10 PROM shoulder flex, abd (below 90) and ER/IR x 5 min  PATIENT EDUCATION: Education details: Access Code: [email or web address] Person educated: Patient Education method: Explanation, Demonstration, and Handouts Education comprehension: verbalized understanding and returned demonstration   HOME EXERCISE PROGRAM: Access Code: [email or web address] URL: [email or web address] Date: 11/22/2021 Prepared by: Claiborne Billings  Exercises - Supine Shoulder Press with Dowel  - 2 x daily - 7 x weekly - 2 sets - 10 reps - Supine Shoulder Flexion with Dowel  - 1 x daily - 7 x weekly - 2 sets - 10 reps - 5-10 hold - Shoulder Extension with Resistance  - 1 x daily - 7 x weekly - 2 sets - 10 reps - Seated Scapular Retraction  - 5 x daily - 7 x weekly - 1 sets - 10 reps - Seated Single Arm Bicep Curls Supinated with Dumbbell  - 1 x daily - 7 x weekly - 2 sets - 10 reps - Seated Shoulder Flexion  - 2 x daily - 7 x weekly - 2 sets - 5 reps - Standing Shoulder Scaption  - 2 x daily - 7 x weekly - 2 sets - 5 reps ASSESSMENT:  CLINICAL IMPRESSION: Pt did well with the addition of 1# weights with flexion and scaption to 90 degrees. Pt reports more use at home with daily tasks.  PT monitored for technique and to improve mechanics with use against gravity.  Pt did fatigue with more reps today. Pt with Rt shoulder flexion and abduction to 90 degrees with ease.  Pt will see MD in 2 days and PT will do reassessment and set new goals if restrictions are lifted.    Patient will benefit from skilled PT to address the below impairments and improve overall function.   OBJECTIVE IMPAIRMENTS decreased activity tolerance, decreased mobility, decreased ROM, decreased strength, increased muscle spasms, impaired flexibility, impaired UE functional use, postural dysfunction, and pain.   ACTIVITY LIMITATIONS carrying, lifting, bathing, dressing, and  hygiene/grooming  PARTICIPATION LIMITATIONS: meal prep, cleaning, laundry, and yard work  PERSONAL FACTORS Time since onset of injury/illness/exacerbation and 1-2 comorbidities: Rt scapular fracture, vertigo/balance  are also affecting patient's functional outcome.   REHAB POTENTIAL: Good  CLINICAL DECISION MAKING: Stable/uncomplicated  EVALUATION COMPLEXITY: Low   GOALS: Goals reviewed with patient? Yes  SHORT TERM GOALS: Target date: 12/07/2021   Be independent in initial HEP Baseline: Goal status: MET (11/22/21)  2.  Demonstrate Rt shoulder A/ROM flexion to > or = to 80 degrees to improve self-care Baseline: 65 Goal status: INITIAL  3.  Report > or = to 70% use of Rt UE with tasks below 90 degrees  Baseline: using below 90 degrees and for eating and other light tasks.  ~50% (11/22/21) Goal status: in progress    LONG TERM GOALS: Target date: 01/04/2022   Be independent in advanced HEP Baseline: advancement as needed when MD lifts restrictions (12/06/21) Goal status: In progress   2.  Improve FOTO to > or = to 65  Baseline: 64 (12/06/21) Goal status: In progress   3.  Demonstrate 90 degrees of Rt shoulder A/ROM flexion to improve self-care Baseline: 90 and limited by MD orders (12/06/21) Goal status: MET  4.  Demonstrate neutral posture to promote scapular healing and allow for return to restored Rt UE A/ROM Baseline: improved posture (12/06/21) Goal status: in progress   5.  New goals will be set once MD restrictions are lifted  Baseline:  Goal status: INITIAL   PLAN: PT FREQUENCY: 1-2x/week  PT DURATION: 8 weeks  PLANNED INTERVENTIONS: Therapeutic exercises, Therapeutic activity, Neuromuscular re-education, Patient/Family education, Self Care, Joint mobilization, Aquatic Therapy, Dry Needling, Electrical stimulation, Cryotherapy, Moist heat, Taping, Ultrasound, Manual therapy, and Re-evaluation  PLAN FOR NEXT SESSION:  See what MD says, follow orders.   ERO next and set new goals for function  Sigurd Sos, PT 12/06/21 11:45 AM   [facility] [address] [place], [place] [address] Phone # [phone] Fax [phone]

## 2021-12-09 ENCOUNTER — Ambulatory Visit: Payer: Managed Care, Other (non HMO) | Attending: Orthopedic Surgery

## 2021-12-09 DIAGNOSIS — R29818 Other symptoms and signs involving the nervous system: Secondary | ICD-10-CM | POA: Insufficient documentation

## 2021-12-09 DIAGNOSIS — R2681 Unsteadiness on feet: Secondary | ICD-10-CM | POA: Insufficient documentation

## 2021-12-09 DIAGNOSIS — G8929 Other chronic pain: Secondary | ICD-10-CM | POA: Diagnosis present

## 2021-12-09 DIAGNOSIS — R293 Abnormal posture: Secondary | ICD-10-CM | POA: Diagnosis present

## 2021-12-09 DIAGNOSIS — R42 Dizziness and giddiness: Secondary | ICD-10-CM | POA: Diagnosis present

## 2021-12-09 DIAGNOSIS — M25611 Stiffness of right shoulder, not elsewhere classified: Secondary | ICD-10-CM | POA: Diagnosis present

## 2021-12-09 DIAGNOSIS — M6281 Muscle weakness (generalized): Secondary | ICD-10-CM | POA: Diagnosis present

## 2021-12-09 DIAGNOSIS — M25511 Pain in right shoulder: Secondary | ICD-10-CM | POA: Diagnosis not present

## 2021-12-09 DIAGNOSIS — H8113 Benign paroxysmal vertigo, bilateral: Secondary | ICD-10-CM | POA: Diagnosis present

## 2021-12-09 NOTE — Therapy (Signed)
OUTPATIENT PHYSICAL THERAPY UPPER EXTREMITY TREATMENT NOTE   Patient Name: Keith Schroeder MRN: [id number] DOB:1960-05-21, 61 y.o., male Today's Date: 12/09/2021   PT End of Session - 12/09/21 0938     Visit Number 8    Number of Visits 13    Date for PT Re-Evaluation 01/04/22    Authorization Type [employer]    PT Start Time 0930    PT Stop Time 1015    PT Time Calculation (min) 45 min    Equipment Utilized During Treatment Back brace;Other (comment)    Activity Tolerance Patient tolerated treatment well                Past Medical History:  Diagnosis Date   Anxiety    GERD (gastroesophageal reflux disease)    Past Surgical History:  Procedure Laterality Date   CHOLECYSTECTOMY     Patient Active Problem List   Diagnosis Date Noted   TBI (traumatic brain injury) ([place]) 09/19/2021    PCP: Shirline Frees, MD   REFERRING PROVIDER: Esmond Plants, MD  REFERRING DIAG: [phone] (ICD-10-CM) - Displaced fracture of coracoid process, right shoulder, initial encounter for closed fracture  THERAPY DIAG:  Chronic right shoulder pain  Stiffness of right shoulder, not elsewhere classified  Abnormal posture  Muscle weakness (generalized)  BPPV (benign paroxysmal positional vertigo), bilateral  Dizziness and giddiness  Unsteadiness on feet  Other symptoms and signs involving the nervous system  Rationale for Evaluation and Treatment Rehabilitation  ONSET DATE: 09/19/21  SUBJECTIVE:                                                                                                                                                                                      SUBJECTIVE STATEMENT: Saw MD and he says we can do full ROM now.  No xray done but MD felt the way I was moving indicated things were healing enough to do overhead activities.    Initial eval: Pt reports to PT with Rt scapular fracture sustained 09/19/21 when he fell through a floor of his attic.  Pt also  sustained a skull fracture and L5 compression fracture. Pt had a 1 week stay in the hospital.  Pt sustained a contusion to frontal lobe.  Pt was in a sling after injury and now has instructions to not raise arm >90 degrees.    PERTINENT HISTORY: TBI, anxiety   PAIN:  Are you having pain? Yes: NPRS scale: 0/10 up to 3/10 with use against gravity.  Pain location: Rt arm, diffuse around shoulder  Pain description: sore/stiffness  Aggravating factors: use, trying to lift arm Relieving factors: rest  PRECAUTIONS: Other: per MD: gentle  therapy, below shoulder only (<90 degrees), yellow theraband ok.    WEIGHT BEARING RESTRICTIONS No  FALLS:  Has patient fallen in last 6 months? No No loss of balance- fell in attic when balancing on truss  LIVING ENVIRONMENT: Lives with: lives with their spouse Lives in: House/apartment Stairs:  4 steps to enter; 14 steps to basement Has following equipment at home: Electronics engineer and Grab bars   OCCUPATION: Retired   PLOF: Independent  PATIENT GOALS return to normal use of Rt UE, improve ROM and strength of Rt UE  OBJECTIVE:  DIAGNOSTIC FINDINGS:  MRI:  Early healing changes involving the fracture through the base of the acromion. Some early areas of bony ingrowth but no solid osseous bridging or significant callus formation. 2. No other fractures are identified. 3. Grossly by CT the rotator cuff tendons are intact.  PATIENT SURVEYS:  FOTO 44 (goal is 39) 12/06/21: 64 (goal is 25)  COGNITION:  Overall cognitive status: Within functional limits for tasks assessed     SENSATION: WFL  POSTURE: Forward head and rounded shoulder   UPPER EXTREMITY ROM:   Active ROM Right eval Right  11/22/21 Right 12/06/21 Left eval  Shoulder flexion 65 (90 PROM) 88 A/ROM 90 with ease Full throughout  Shoulder extension full     Shoulder abduction 80 (90 PROM)  90 with ease    Shoulder adduction      Shoulder internal rotation NT     Shoulder  external rotation NT     Elbow flexion full     Elbow extension      Wrist flexion      Wrist extension      Wrist ulnar deviation      Wrist radial deviation      Wrist pronation      Wrist supination      (Blank rows = not tested)  UPPER EXTREMITY MMT:  MMT Right eval Left eval  Shoulder flexion Submax testing in neutral: 4+/5 5/5 throughout  Shoulder extension Submax testing in neutral: 4+/5   Shoulder abduction    Shoulder adduction    Shoulder internal rotation    Shoulder external rotation    Middle trapezius    Lower trapezius    Elbow flexion 4+/5   Elbow extension    Wrist flexion    Wrist extension    Wrist ulnar deviation    Wrist radial deviation    Wrist pronation    Wrist supination    Grip strength (lbs)    (Blank rows = not tested)   PALPATION:  Palpable tenderness over Rt anterior glenohumeral joint.  Tension in Rt pecs, tender at axilla, anterior deltoid.  Limited joint mobility in all directions with significant guarding   TODAY'S TREATMENT:  Date: 12/09/21 UBE seated: level 1.5 x 6 min (3/3)- PT present to discuss progress  3 way scapular stabilization with blue loop right 4 D ball rolls x 20 right with red plyo ball Wall clocks with red plyo ball x 5 Standing shoulder flexion and scaption with 1 lb 2 x 10 each right Pulleys x 2 min each of flexion, scaption and IR right Prone shoulder extension, row and horizontal abduction with 1 lb right x 10 each Side lying ER x 10 with 1 lb right Supine serratus punch x 10 with 1 lb right  Date: 12/06/21 UBE standing: level 1.5 x 6 min (3/3)- PT present to discuss progress  Supine: yellow theraband 2x10 Finger ladder x 10 flexion and  abduction 1# added below shoulder level Seated shoulder flexion and scaption to 90 degrees Rt 2 x10.  1# added  ER with yellow band in sitting 2x10 Supine: narrow grip chest press 2# 2x10 Supine shoulder flexion to 90 degrees 2# 2x10 Side lying shoulder flexion- no  weight 2x10   Date: 11/29/21 UBE standing: level 1.5 x 6 min (3/3)- PT present to discuss progress  4 D oscillations with UE Ranger on wall x 10 each Finger ladder x 10 flexion and abduction below shoulder level Seated shoulder flexion and scaption to 90 degrees Rt 2 x 10 Supine theraband: horizontal abduction and D2 yellow 2x10 bil each PROM shoulder flex, abd (below 90) and ER/IR x 5 min   PATIENT EDUCATION: Education details: Access Code: [email or web address] Person educated: Patient Education method: Explanation, Demonstration, and Handouts Education comprehension: verbalized understanding and returned demonstration   HOME EXERCISE PROGRAM: Access Code: [email or web address] URL: [email or web address] Date: 12/09/2021 Prepared by: Candyce Churn  Exercises - Supine Shoulder Press with Dowel  - 2 x daily - 7 x weekly - 2 sets - 10 reps - Supine Shoulder Flexion with Dowel  - 1 x daily - 7 x weekly - 2 sets - 10 reps - 5-10 hold - Shoulder Extension with Resistance  - 1 x daily - 7 x weekly - 2 sets - 10 reps - Seated Scapular Retraction  - 5 x daily - 7 x weekly - 1 sets - 10 reps - Seated Single Arm Bicep Curls Supinated with Dumbbell  - 1 x daily - 7 x weekly - 2 sets - 10 reps - Prone Shoulder Extension - Single Arm  - 2 x daily - 7 x weekly - 2 sets - 10 reps - Prone Shoulder Row  - 2 x daily - 7 x weekly - 2 sets - 10 reps - Prone Single Arm Shoulder Horizontal Abduction with Scapular Retraction and Palm Down  - 2 x daily - 7 x weekly - 2 sets - 10 reps - Sidelying Shoulder External Rotation  - 2 x daily - 7 x weekly - 2 sets - 10 reps - Single Arm Serratus Punches in Supine with Dumbbell  - 2 x daily - 7 x weekly - 2 sets - 10 reps - Standing Shoulder Flexion to 90 Degrees with Dumbbells  - 2 x daily - 7 x weekly - 2 sets - 10 reps - Standing Shoulder Scaption  - 2 x daily - 7 x weekly - 2 sets - 10 repsAccess Code: [email or web address] URL: [email or web address] Date:  11/22/2021 Prepared by: Claiborne Billings  ASSESSMENT:  CLINICAL IMPRESSION: Jyles had some discomfort with beginning overhead reach activities and resistance exercises.  He initially had some crepitus with strengthening exercises but this diminished as he continued with strengthening.  We updated HEP and provided written copies.  He was instructed to hold on any exercises that cause an excessive amount of pain that does not subside quickly.    Patient will benefit from skilled PT to address the below impairments and improve overall function.   OBJECTIVE IMPAIRMENTS decreased activity tolerance, decreased mobility, decreased ROM, decreased strength, increased muscle spasms, impaired flexibility, impaired UE functional use, postural dysfunction, and pain.   ACTIVITY LIMITATIONS carrying, lifting, bathing, dressing, and hygiene/grooming  PARTICIPATION LIMITATIONS: meal prep, cleaning, laundry, and yard work  PERSONAL FACTORS Time since onset of injury/illness/exacerbation and 1-2 comorbidities: Rt scapular fracture, vertigo/balance  are also affecting patient's functional outcome.   REHAB POTENTIAL: Good  CLINICAL DECISION MAKING: Stable/uncomplicated  EVALUATION COMPLEXITY: Low   GOALS: Goals reviewed with patient? Yes  SHORT TERM GOALS: Target date: 12/07/2021   Be independent in initial HEP Baseline: Goal status: MET (11/22/21)  2.  Demonstrate Rt shoulder A/ROM flexion to > or = to 80 degrees to improve self-care Baseline: 65 Goal status: INITIAL  3.  Report > or = to 70% use of Rt UE with tasks below 90 degrees  Baseline: using below 90 degrees and for eating and other light tasks.  ~50% (11/22/21) Goal status: in progress    LONG TERM GOALS: Target date: 01/04/2022   Be independent in advanced HEP Baseline: advancement as needed when MD lifts restrictions (12/06/21) Goal status: In progress   2.  Improve FOTO to > or = to 65  Baseline: 64 (12/06/21) Goal status: In progress    3.  Demonstrate 90 degrees of Rt shoulder A/ROM flexion to improve self-care Baseline: 90 and limited by MD orders (12/06/21) Goal status: MET  4.  Demonstrate neutral posture to promote scapular healing and allow for return to restored Rt UE A/ROM Baseline: improved posture (12/06/21) Goal status: in progress   5.  New goals will be set once MD restrictions are lifted  Baseline:  Goal status: INITIAL   PLAN: PT FREQUENCY: 1-2x/week  PT DURATION: 8 weeks  PLANNED INTERVENTIONS: Therapeutic exercises, Therapeutic activity, Neuromuscular re-education, Patient/Family education, Self Care, Joint mobilization, Aquatic Therapy, Dry Needling, Electrical stimulation, Cryotherapy, Moist heat, Taping, Ultrasound, Manual therapy, and Re-evaluation  PLAN FOR NEXT SESSION:  See what MD says, follow orders.  ERO next and set new goals for function  Shaunie Boehm B. Jashiya Bassett, PT 12/09/21 11:20 AM   [facility] [address], [place], [place] [address] Phone # [phone] Fax [phone]

## 2021-12-13 ENCOUNTER — Ambulatory Visit: Payer: Managed Care, Other (non HMO)

## 2021-12-13 DIAGNOSIS — R293 Abnormal posture: Secondary | ICD-10-CM

## 2021-12-13 DIAGNOSIS — G8929 Other chronic pain: Secondary | ICD-10-CM

## 2021-12-13 DIAGNOSIS — M6281 Muscle weakness (generalized): Secondary | ICD-10-CM

## 2021-12-13 DIAGNOSIS — M25511 Pain in right shoulder: Secondary | ICD-10-CM | POA: Diagnosis not present

## 2021-12-13 DIAGNOSIS — M25611 Stiffness of right shoulder, not elsewhere classified: Secondary | ICD-10-CM

## 2021-12-13 NOTE — Therapy (Signed)
OUTPATIENT PHYSICAL THERAPY UPPER EXTREMITY TREATMENT NOTE   Patient Name: Keith Schroeder MRN: [id number] DOB:January 04, 1961, 61 y.o., male Today's Date: 12/13/2021   PT End of Session - 12/13/21 1148     Visit Number 9    Date for PT Re-Evaluation 02/07/22    Authorization Type [employer]    PT Start Time 1104    PT Stop Time 1147    PT Time Calculation (min) 43 min    Activity Tolerance Patient tolerated treatment well    Behavior During Therapy WFL for tasks assessed/performed                 Past Medical History:  Diagnosis Date   Anxiety    GERD (gastroesophageal reflux disease)    Past Surgical History:  Procedure Laterality Date   CHOLECYSTECTOMY     Patient Active Problem List   Diagnosis Date Noted   TBI (traumatic brain injury) ([place]) 09/19/2021    PCP: Shirline Frees, MD   REFERRING PROVIDER: Esmond Plants, MD  REFERRING DIAG: [phone] (ICD-10-CM) - Displaced fracture of coracoid process, right shoulder, initial encounter for closed fracture  THERAPY DIAG:  Chronic right shoulder pain - Plan: PT plan of care cert/re-cert  Stiffness of right shoulder, not elsewhere classified - Plan: PT plan of care cert/re-cert  Abnormal posture - Plan: PT plan of care cert/re-cert  Muscle weakness (generalized) - Plan: PT plan of care cert/re-cert  Rationale for Evaluation and Treatment Rehabilitation  ONSET DATE: 09/19/21  SUBJECTIVE:                                                                                                                                                                                      SUBJECTIVE STATEMENT: I was a little sore after last session but not bad.      Initial eval: Pt reports to PT with Rt scapular fracture sustained 09/19/21 when he fell through a floor of his attic.  Pt also sustained a skull fracture and L5 compression fracture. Pt had a 1 week stay in the hospital.  Pt sustained a contusion to frontal lobe.  Pt was in a  sling after injury and now has instructions to not raise arm >90 degrees.    PERTINENT HISTORY: TBI, anxiety   PAIN:  Are you having pain? Yes: NPRS scale: 0/10 up to 3/10 with use against gravity.  Pain location: Rt arm, diffuse around shoulder  Pain description: sore/stiffness  Aggravating factors: use, trying to lift arm Relieving factors: rest  PRECAUTIONS: Other: per MD: gentle therapy, below shoulder only (<90 degrees), yellow theraband ok.    WEIGHT BEARING RESTRICTIONS No  FALLS:  Has patient  fallen in last 6 months? No No loss of balance- fell in attic when balancing on truss  LIVING ENVIRONMENT: Lives with: lives with their spouse Lives in: House/apartment Stairs:  4 steps to enter; 14 steps to basement Has following equipment at home: Electronics engineer and Grab bars   OCCUPATION: Retired   PLOF: Independent  PATIENT GOALS return to normal use of Rt UE, improve ROM and strength of Rt UE  OBJECTIVE:  DIAGNOSTIC FINDINGS:  MRI:  Early healing changes involving the fracture through the base of the acromion. Some early areas of bony ingrowth but no solid osseous bridging or significant callus formation. 2. No other fractures are identified. 3. Grossly by CT the rotator cuff tendons are intact.  PATIENT SURVEYS:  FOTO 44 (goal is 25) 12/06/21: 64 (goal is 100)  COGNITION:  Overall cognitive status: Within functional limits for tasks assessed     SENSATION: WFL  POSTURE: Forward head and rounded shoulder   UPPER EXTREMITY ROM:   Active ROM Right eval Right  11/22/21 Right 12/06/21 Right  12/13/21 Left eval  Shoulder flexion 65 (90 PROM) 88 A/ROM 90 with ease 115 Full throughout  Shoulder extension full      Shoulder abduction 80 (90 PROM)  90 with ease  105   Shoulder adduction       Shoulder internal rotation NT   L2   Shoulder external rotation NT   T2   Elbow flexion full      Elbow extension       Wrist flexion       Wrist extension       Wrist  ulnar deviation       Wrist radial deviation       Wrist pronation       Wrist supination       (Blank rows = not tested)  UPPER EXTREMITY MMT:  MMT Right eval Right  12/13/21 Left eval  Shoulder flexion Submax testing in neutral: 4+/5 4- 5/5 throughout  Shoulder extension Submax testing in neutral: 4+/5    Shoulder abduction  3+   Shoulder adduction     Shoulder internal rotation  4   Shoulder external rotation  4+   Middle trapezius     Lower trapezius     Elbow flexion 4+/5    Elbow extension     Wrist flexion     Wrist extension     Wrist ulnar deviation     Wrist radial deviation     Wrist pronation     Wrist supination     Grip strength (lbs)     (Blank rows = not tested)   PALPATION:  Palpable tenderness over Rt anterior glenohumeral joint.  Tension in Rt pecs, tender at axilla, anterior deltoid.  Limited joint mobility in all directions with significant guarding   TODAY'S TREATMENT:  Date: 12/13/21 UBE seated: level 2 x 6 min (3/3)- PT present to discuss progress  Supine chest press with cane: 3# added 2x10 Standing shoulder flexion and scaption with 1 lb 2 x 10 each right Finger ladder: flexion and abduction x 10 with gentle overpressure IR with towel x5 Seated UE ranger: flexion and horizontal abduction Rt x2 min each Overhead flexion with cane: 3# added x10  Date: 12/09/21 UBE seated: level 1.5 x 6 min (3/3)- PT present to discuss progress  3 way scapular stabilization with blue loop right 4 D ball rolls x 20 right with red plyo ball Wall clocks with red plyo  ball x 5 Standing shoulder flexion and scaption with 1 lb 2 x 10 each right Pulleys x 2 min each of flexion, scaption and IR right Prone shoulder extension, row and horizontal abduction with 1 lb right x 10 each Side lying ER x 10 with 1 lb right Supine serratus punch x 10 with 1 lb right  Date: 12/06/21 UBE standing: level 1.5 x 6 min (3/3)- PT present to discuss progress  Supine: yellow  theraband 2x10 Finger ladder x 10 flexion and abduction 1# added below shoulder level Seated shoulder flexion and scaption to 90 degrees Rt 2 x10.  1# added  ER with yellow band in sitting 2x10 Supine: narrow grip chest press 2# 2x10 Supine shoulder flexion to 90 degrees 2# 2x10 Side lying shoulder flexion- no weight 2x10  PATIENT EDUCATION: Education details: Access Code: [email or web address] Person educated: Patient Education method: Explanation, Demonstration, and Handouts Education comprehension: verbalized understanding and returned demonstration   HOME EXERCISE PROGRAM: Access Code: [email or web address] URL: [email or web address] Date: 12/09/2021 Prepared by: Candyce Churn  Exercises - Supine Shoulder Press with Dowel  - 2 x daily - 7 x weekly - 2 sets - 10 reps - Supine Shoulder Flexion with Dowel  - 1 x daily - 7 x weekly - 2 sets - 10 reps - 5-10 hold - Shoulder Extension with Resistance  - 1 x daily - 7 x weekly - 2 sets - 10 reps - Seated Scapular Retraction  - 5 x daily - 7 x weekly - 1 sets - 10 reps - Seated Single Arm Bicep Curls Supinated with Dumbbell  - 1 x daily - 7 x weekly - 2 sets - 10 reps - Prone Shoulder Extension - Single Arm  - 2 x daily - 7 x weekly - 2 sets - 10 reps - Prone Shoulder Row  - 2 x daily - 7 x weekly - 2 sets - 10 reps - Prone Single Arm Shoulder Horizontal Abduction with Scapular Retraction and Palm Down  - 2 x daily - 7 x weekly - 2 sets - 10 reps - Sidelying Shoulder External Rotation  - 2 x daily - 7 x weekly - 2 sets - 10 reps - Single Arm Serratus Punches in Supine with Dumbbell  - 2 x daily - 7 x weekly - 2 sets - 10 reps - Standing Shoulder Flexion to 90 Degrees with Dumbbells  - 2 x daily - 7 x weekly - 2 sets - 10 reps - Standing Shoulder Scaption  - 2 x daily - 7 x weekly - 2 sets - 10 repsAccess Code: [email or web address]  ASSESSMENT:  CLINICAL IMPRESSION: Pt saw MD last week and was told that he is ok to reach overhead and 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  Pt reports that he is not able to lift heavy things at home and has not returned to regular.  Rt shoulder A/ROM and strength is limited in all directions and pt is going to continue to work on this at home now that ROM restrictions are lifted.  Goals have been updated to improve functional use of Rt UE. Patient will benefit from skilled PT to address the below impairments and improve overall function.   OBJECTIVE IMPAIRMENTS decreased activity tolerance, decreased mobility, decreased ROM, decreased strength, increased muscle spasms, impaired flexibility, impaired UE functional use, postural dysfunction, and pain.   ACTIVITY LIMITATIONS carrying, lifting, bathing, dressing, and hygiene/grooming  PARTICIPATION LIMITATIONS: meal prep, cleaning, laundry, and yard work  PERSONAL FACTORS Time since onset  of injury/illness/exacerbation and 1-2 comorbidities: Rt scapular fracture, vertigo/balance  are also affecting patient's functional outcome.   REHAB POTENTIAL: Good  CLINICAL DECISION MAKING: Stable/uncomplicated  EVALUATION COMPLEXITY: Low   GOALS: Goals reviewed with patient? Yes  SHORT TERM GOALS: Target date: 12/07/2021   Be independent in initial HEP Baseline: Goal status: MET (11/22/21)  2.  Demonstrate Rt shoulder A/ROM flexion to > or = to 80 degrees to improve self-care Baseline: 65 Goal status: INITIAL  3.  Report > or = to 70% use of Rt UE with tasks below 90 degrees  Baseline: using >90 degrees, ~70%  Goal status: MET    LONG TERM GOALS: Target date: 02/07/22 Be independent in advanced HEP Baseline: advancement as needed now that restrictions are lifted (12/13/21) Goal status: In progress   2.  Improve FOTO to > or = to 65  Baseline: 64 (12/13/21) Goal status: In progress   3.  Demonstrate > or = to 135 degrees of Rt shoulder A/ROM flexion to improve ADLs  Baseline: 115 (12/13/21) Goal status: REVISED  4.  Demonstrate neutral posture to promote scapular  healing and allow for return to restored Rt UE A/ROM Baseline: improved posture, but still working on this  (12/06/21) Goal status: in progress   5.  Demonstrate Rt shoulder IR to > or = to T10 to improve self care Baseline: L2 Goal status: INITIAL   6. Demonstrate > or = to 4+/5 to 5/5 Rt shoulder strength to improve endurance and safety for lifting   Baseline: 4/5    Goal Status: INITIAL   PLAN: PT FREQUENCY: 1-2x/week  PT DURATION: 8 weeks  PLANNED INTERVENTIONS: Therapeutic exercises, Therapeutic activity, Neuromuscular re-education, Patient/Family education, Self Care, Joint mobilization, Aquatic Therapy, Dry Needling, Electrical stimulation, Cryotherapy, Moist heat, Taping, Ultrasound, Manual therapy, and Re-evaluation  PLAN FOR NEXT SESSION:  ROM progression, gentle strength and endurance as tolerated   Sigurd Sos, PT 12/13/21 11:48 AM   [facility] [address] [place], [place] [address] Phone # [phone] Fax [phone]

## 2021-12-15 ENCOUNTER — Ambulatory Visit: Payer: Managed Care, Other (non HMO)

## 2021-12-15 DIAGNOSIS — G8929 Other chronic pain: Secondary | ICD-10-CM

## 2021-12-15 DIAGNOSIS — R293 Abnormal posture: Secondary | ICD-10-CM

## 2021-12-15 DIAGNOSIS — M25611 Stiffness of right shoulder, not elsewhere classified: Secondary | ICD-10-CM

## 2021-12-15 DIAGNOSIS — M25511 Pain in right shoulder: Secondary | ICD-10-CM | POA: Diagnosis not present

## 2021-12-15 NOTE — Therapy (Signed)
OUTPATIENT PHYSICAL THERAPY UPPER EXTREMITY TREATMENT NOTE   Patient Name: Keith Schroeder MRN: [id number] DOB:1960/11/23, 61 y.o., male Today's Date: 12/15/2021   PT End of Session - 12/15/21 1143     Visit Number 10    Date for PT Re-Evaluation 02/07/22    Authorization Type [employer]    PT Start Time 1103    PT Stop Time 1144    PT Time Calculation (min) 41 min    Activity Tolerance Patient tolerated treatment well    Behavior During Therapy WFL for tasks assessed/performed                  Past Medical History:  Diagnosis Date   Anxiety    GERD (gastroesophageal reflux disease)    Past Surgical History:  Procedure Laterality Date   CHOLECYSTECTOMY     Patient Active Problem List   Diagnosis Date Noted   TBI (traumatic brain injury) ([place]) 09/19/2021    PCP: Shirline Frees, MD   REFERRING PROVIDER: Esmond Plants, MD  REFERRING DIAG: [phone] (ICD-10-CM) - Displaced fracture of coracoid process, right shoulder, initial encounter for closed fracture  THERAPY DIAG:  Chronic right shoulder pain  Stiffness of right shoulder, not elsewhere classified  Abnormal posture  Rationale for Evaluation and Treatment Rehabilitation  ONSET DATE: 09/19/21  SUBJECTIVE:                                                                                                                                                                                      SUBJECTIVE STATEMENT: I'm doing good with strength progression.   Initial eval: Pt reports to PT with Rt scapular fracture sustained 09/19/21 when he fell through a floor of his attic.  Pt also sustained a skull fracture and L5 compression fracture. Pt had a 1 week stay in the hospital.  Pt sustained a contusion to frontal lobe.  Pt was in a sling after injury and now has instructions to not raise arm >90 degrees.    PERTINENT HISTORY: TBI, anxiety   PAIN:  Are you having pain? Yes: NPRS scale: 0/10 up to 3/10 with use  against gravity.  Pain location: Rt arm, diffuse around shoulder  Pain description: sore/stiffness  Aggravating factors: use, trying to lift arm Relieving factors: rest  PRECAUTIONS: Other: per MD: gentle therapy, below shoulder only (<90 degrees), yellow theraband ok.    WEIGHT BEARING RESTRICTIONS No  FALLS:  Has patient fallen in last 6 months? No No loss of balance- fell in attic when balancing on truss  LIVING ENVIRONMENT: Lives with: lives with their spouse Lives in: House/apartment Stairs:  4 steps to enter; 14 steps to basement  Has following equipment at home: Shower bench and Grab bars   OCCUPATION: Retired   PLOF: [place] return to normal use of Rt UE, improve ROM and strength of Rt UE  OBJECTIVE:  DIAGNOSTIC FINDINGS:  MRI:  Early healing changes involving the fracture through the base of the acromion. Some early areas of bony ingrowth but no solid osseous bridging or significant callus formation. 2. No other fractures are identified. 3. Grossly by CT the rotator cuff tendons are intact.  PATIENT SURVEYS:  FOTO 44 (goal is 30) 12/06/21: 64 (goal is 59)  COGNITION:  Overall cognitive status: Within functional limits for tasks assessed     SENSATION: WFL  POSTURE: Forward head and rounded shoulder   UPPER EXTREMITY ROM:   Active ROM Right eval Right  11/22/21 Right 12/06/21 Right  12/13/21 Left eval  Shoulder flexion 65 (90 PROM) 88 A/ROM 90 with ease 115 Full throughout  Shoulder extension full      Shoulder abduction 80 (90 PROM)  90 with ease  105   Shoulder adduction       Shoulder internal rotation NT   L2   Shoulder external rotation NT   T2   Elbow flexion full      Elbow extension       Wrist flexion       Wrist extension       Wrist ulnar deviation       Wrist radial deviation       Wrist pronation       Wrist supination       (Blank rows = not tested)  UPPER EXTREMITY MMT:  MMT Right eval Right  12/13/21  Left eval  Shoulder flexion Submax testing in neutral: 4+/5 4- 5/5 throughout  Shoulder extension Submax testing in neutral: 4+/5    Shoulder abduction  3+   Shoulder adduction     Shoulder internal rotation  4   Shoulder external rotation  4+   Middle trapezius     Lower trapezius     Elbow flexion 4+/5    Elbow extension     Wrist flexion     Wrist extension     Wrist ulnar deviation     Wrist radial deviation     Wrist pronation     Wrist supination     Grip strength (lbs)     (Blank rows = not tested)   PALPATION:  Palpable tenderness over Rt anterior glenohumeral joint.  Tension in Rt pecs, tender at axilla, anterior deltoid.  Limited joint mobility in all directions with significant guarding   TODAY'S TREATMENT:  Date: 12/15/21 UBE seated: level 2.2 x 8 min (4/4)- PT present to discuss progress  Supine chest press with 3# dumbbell added 2x10 Standing shoulder flexion and scaption with 1 lb 2 x 10 each right Finger ladder: flexion and abduction x 10 with gentle overpressure 1# added  Seated UE ranger: flexion and horizontal abduction Rt x2 min each Overhead flexion 3# weight added x10 Manual: P/ROM and inferior glenohumeral joint mobs to Rt shoulder   Date: 12/13/21 UBE seated: level 2 x 6 min (3/3)- PT present to discuss progress  Supine chest press with cane: 3# added 2x10 Standing shoulder flexion and scaption with 1 lb 2 x 10 each right Finger ladder: flexion and abduction x 10 with gentle overpressure IR with towel x5 Seated UE ranger: flexion and horizontal abduction Rt x2 min each Overhead flexion with cane: 3# added x10  Date: 12/09/21 UBE seated: level 1.5 x 6 min (3/3)- PT present to discuss progress  3 way scapular stabilization with blue loop right 4 D ball rolls x 20 right with red plyo ball Wall clocks with red plyo ball x 5 Standing shoulder flexion and scaption with 1 lb 2 x 10 each right Pulleys x 2 min each of flexion, scaption and IR  right Prone shoulder extension, row and horizontal abduction with 1 lb right x 10 each Side lying ER x 10 with 1 lb right Supine serratus punch x 10 with 1 lb right  PATIENT EDUCATION: Education details: Access Code: [email or web address] Person educated: Patient Education method: Explanation, Demonstration, and Handouts Education comprehension: verbalized understanding and returned demonstration   HOME EXERCISE PROGRAM: Access Code: [email or web address] URL: [email or web address] Date: 12/09/2021 Prepared by: Candyce Churn  Exercises - Supine Shoulder Press with Dowel  - 2 x daily - 7 x weekly - 2 sets - 10 reps - Supine Shoulder Flexion with Dowel  - 1 x daily - 7 x weekly - 2 sets - 10 reps - 5-10 hold - Shoulder Extension with Resistance  - 1 x daily - 7 x weekly - 2 sets - 10 reps - Seated Scapular Retraction  - 5 x daily - 7 x weekly - 1 sets - 10 reps - Seated Single Arm Bicep Curls Supinated with Dumbbell  - 1 x daily - 7 x weekly - 2 sets - 10 reps - Prone Shoulder Extension - Single Arm  - 2 x daily - 7 x weekly - 2 sets - 10 reps - Prone Shoulder Row  - 2 x daily - 7 x weekly - 2 sets - 10 reps - Prone Single Arm Shoulder Horizontal Abduction with Scapular Retraction and Palm Down  - 2 x daily - 7 x weekly - 2 sets - 10 reps - Sidelying Shoulder External Rotation  - 2 x daily - 7 x weekly - 2 sets - 10 reps - Single Arm Serratus Punches in Supine with Dumbbell  - 2 x daily - 7 x weekly - 2 sets - 10 reps - Standing Shoulder Flexion to 90 Degrees with Dumbbells  - 2 x daily - 7 x weekly - 2 sets - 10 reps - Standing Shoulder Scaption  - 2 x daily - 7 x weekly - 2 sets - 10 repsAccess Code: [email or web address]  ASSESSMENT:  CLINICAL IMPRESSION: Pt is doing well with progression of exercises for gentle strength and flexibility.   Pt reports that he is not able to lift heavy things at home and has not returned to regular use.  Pt with some soreness with scaption ROM.  PT monitored for technique  and tactile cues for scapular depression.  Patient will benefit from skilled PT to address the below impairments and improve overall function.   OBJECTIVE IMPAIRMENTS decreased activity tolerance, decreased mobility, decreased ROM, decreased strength, increased muscle spasms, impaired flexibility, impaired UE functional use, postural dysfunction, and pain.   ACTIVITY LIMITATIONS carrying, lifting, bathing, dressing, and hygiene/grooming  PARTICIPATION LIMITATIONS: meal prep, cleaning, laundry, and yard work  PERSONAL FACTORS Time since onset of injury/illness/exacerbation and 1-2 comorbidities: Rt scapular fracture, vertigo/balance  are also affecting patient's functional outcome.   REHAB POTENTIAL: Good  CLINICAL DECISION MAKING: Stable/uncomplicated  EVALUATION COMPLEXITY: Low   GOALS: Goals reviewed with patient? Yes  SHORT TERM GOALS: Target date: 12/07/2021   Be independent in initial HEP Baseline: Goal status: MET (11/22/21)  2.  Demonstrate Rt shoulder A/ROM  flexion to > or = to 80 degrees to improve self-care Baseline: 65 Goal status: INITIAL  3.  Report > or = to 70% use of Rt UE with tasks below 90 degrees  Baseline: using >90 degrees, ~70%  Goal status: MET    LONG TERM GOALS: Target date: 02/07/22 Be independent in advanced HEP Baseline: advancement as needed now that restrictions are lifted (12/13/21) Goal status: In progress   2.  Improve FOTO to > or = to 65  Baseline: 64 (12/13/21) Goal status: In progress   3.  Demonstrate > or = to 135 degrees of Rt shoulder A/ROM flexion to improve ADLs  Baseline: 115 (12/13/21) Goal status: REVISED  4.  Demonstrate neutral posture to promote scapular healing and allow for return to restored Rt UE A/ROM Baseline: improved posture, but still working on this  (12/06/21) Goal status: in progress   5.  Demonstrate Rt shoulder IR to > or = to T10 to improve self care Baseline: L2 Goal status: INITIAL   6. Demonstrate >  or = to 4+/5 to 5/5 Rt shoulder strength to improve endurance and safety for lifting   Baseline: 4/5    Goal Status: INITIAL   PLAN: PT FREQUENCY: 1-2x/week  PT DURATION: 8 weeks  PLANNED INTERVENTIONS: Therapeutic exercises, Therapeutic activity, Neuromuscular re-education, Patient/Family education, Self Care, Joint mobilization, Aquatic Therapy, Dry Needling, Electrical stimulation, Cryotherapy, Moist heat, Taping, Ultrasound, Manual therapy, and Re-evaluation  PLAN FOR NEXT SESSION:  ROM progression, gentle strength and endurance as tolerated   Sigurd Sos, PT 12/15/21 11:45 AM   [facility] [address], [place], [place] [address] Phone # [phone] Fax [phone]

## 2021-12-20 ENCOUNTER — Ambulatory Visit: Payer: Managed Care, Other (non HMO)

## 2021-12-20 DIAGNOSIS — M25511 Pain in right shoulder: Secondary | ICD-10-CM | POA: Diagnosis not present

## 2021-12-20 DIAGNOSIS — M6281 Muscle weakness (generalized): Secondary | ICD-10-CM

## 2021-12-20 DIAGNOSIS — R293 Abnormal posture: Secondary | ICD-10-CM

## 2021-12-20 DIAGNOSIS — G8929 Other chronic pain: Secondary | ICD-10-CM

## 2021-12-20 DIAGNOSIS — M25611 Stiffness of right shoulder, not elsewhere classified: Secondary | ICD-10-CM

## 2021-12-20 NOTE — Therapy (Signed)
OUTPATIENT PHYSICAL THERAPY UPPER EXTREMITY TREATMENT NOTE   Patient Name: Keith Schroeder MRN: [id number] DOB:[birth year], 61 y.o., male Today's Date: 12/20/2021   PT End of Session - 12/20/21 1146     Visit Number 11    Date for PT Re-Evaluation 02/07/22    Authorization Type [employer]    PT Start Time 1101    PT Stop Time 1146    PT Time Calculation (min) 45 min    Activity Tolerance Patient tolerated treatment well                   Past Medical History:  Diagnosis Date   Anxiety    GERD (gastroesophageal reflux disease)    Past Surgical History:  Procedure Laterality Date   CHOLECYSTECTOMY     Patient Active Problem List   Diagnosis Date Noted   TBI (traumatic brain injury) ([place]) 09/19/2021    PCP: Shirline Frees, MD   REFERRING PROVIDER: Esmond Plants, MD  REFERRING DIAG: [phone] (ICD-10-CM) - Displaced fracture of coracoid process, right shoulder, initial encounter for closed fracture  THERAPY DIAG:  Chronic right shoulder pain  Stiffness of right shoulder, not elsewhere classified  Abnormal posture  Muscle weakness (generalized)  Rationale for Evaluation and Treatment Rehabilitation  ONSET DATE: 09/19/21  SUBJECTIVE:                                                                                                                                                                                      SUBJECTIVE STATEMENT: Doing well with using my arm more.   Initial eval: Pt reports to PT with Rt scapular fracture sustained 09/19/21 when he fell through a floor of his attic.  Pt also sustained a skull fracture and L5 compression fracture. Pt had a 1 week stay in the hospital.  Pt sustained a contusion to frontal lobe.  Pt was in a sling after injury and now has instructions to not raise arm >90 degrees.    PERTINENT HISTORY: TBI, anxiety   PAIN:  Are you having pain? Yes: NPRS scale: 0/10 up to 3/10 with use against gravity.  Pain location: Rt  arm, diffuse around shoulder  Pain description: sore/stiffness  Aggravating factors: use, trying to lift arm Relieving factors: rest  PRECAUTIONS: Other: per MD: gentle therapy, below shoulder only (<90 degrees), yellow theraband ok.    WEIGHT BEARING RESTRICTIONS No  FALLS:  Has patient fallen in last 6 months? No No loss of balance- fell in attic when balancing on truss  LIVING ENVIRONMENT: Lives with: lives with their spouse Lives in: House/apartment Stairs:  4 steps to enter; 14 steps to basement Has following equipment at  home: Electronics engineer and Grab bars   OCCUPATION: Retired   PLOF: Independent  PATIENT GOALS return to normal use of Rt UE, improve ROM and strength of Rt UE  OBJECTIVE:  DIAGNOSTIC FINDINGS:  MRI:  Early healing changes involving the fracture through the base of the acromion. Some early areas of bony ingrowth but no solid osseous bridging or significant callus formation. 2. No other fractures are identified. 3. Grossly by CT the rotator cuff tendons are intact.  PATIENT SURVEYS:  FOTO 44 (goal is 41) 12/06/21: 64 (goal is 21)  COGNITION:  Overall cognitive status: Within functional limits for tasks assessed     SENSATION: WFL  POSTURE: Forward head and rounded shoulder   UPPER EXTREMITY ROM:   Active ROM Right eval Right  11/22/21 Right 12/06/21 Right  12/13/21 Left eval  Shoulder flexion 65 (90 PROM) 88 A/ROM 90 with ease 115 Full throughout  Shoulder extension full      Shoulder abduction 80 (90 PROM)  90 with ease  105   Shoulder adduction       Shoulder internal rotation NT   L2   Shoulder external rotation NT   T2   Elbow flexion full      Elbow extension       Wrist flexion       Wrist extension       Wrist ulnar deviation       Wrist radial deviation       Wrist pronation       Wrist supination       (Blank rows = not tested)  UPPER EXTREMITY MMT:  MMT Right eval Right  12/13/21 Left eval  Shoulder flexion Submax  testing in neutral: 4+/5 4- 5/5 throughout  Shoulder extension Submax testing in neutral: 4+/5    Shoulder abduction  3+   Shoulder adduction     Shoulder internal rotation  4   Shoulder external rotation  4+   Middle trapezius     Lower trapezius     Elbow flexion 4+/5    Elbow extension     Wrist flexion     Wrist extension     Wrist ulnar deviation     Wrist radial deviation     Wrist pronation     Wrist supination     Grip strength (lbs)     (Blank rows = not tested)   PALPATION:  Palpable tenderness over Rt anterior glenohumeral joint.  Tension in Rt pecs, tender at axilla, anterior deltoid.  Limited joint mobility in all directions with significant guarding   TODAY'S TREATMENT:  Date: 12/20/21 UBE seated: level 2.2 x 8 min (4/4)- PT present to discuss progress  Supine chest press with 4# dumbbell added 2x10 Standing shoulder flexion and scaption with 1 lb 2 x 10 each right Finger ladder: flexion and abduction x 10 with gentle overpressure 1# added  Cone stack to 2nd shelf: 1# added to Rt 1 min then 2 min  ER with 1# sidelying 2x10  Manual: P/ROM and inferior glenohumeral joint mobs to Rt shoulder   Date: 12/15/21 UBE seated: level 2.2 x 8 min (4/4)- PT present to discuss progress  Supine chest press with 3# dumbbell added 2x10 Standing shoulder flexion and scaption with 1 lb 2 x 10 each right Finger ladder: flexion and abduction x 10 with gentle overpressure 1# added  Seated UE ranger: flexion and horizontal abduction Rt x2 min each Overhead flexion 3# weight added x10 Manual: P/ROM and  inferior glenohumeral joint mobs to Rt shoulder   Date: 12/13/21 UBE seated: level 2 x 6 min (3/3)- PT present to discuss progress  Supine chest press with cane: 3# added 2x10 Standing shoulder flexion and scaption with 1 lb 2 x 10 each right Finger ladder: flexion and abduction x 10 with gentle overpressure IR with towel x5 Seated UE ranger: flexion and horizontal abduction Rt x2  min each Overhead flexion with cane: 3# added x10  PATIENT EDUCATION: Education details: Access Code: [email or web address] Person educated: Patient Education method: Explanation, Demonstration, and Handouts Education comprehension: verbalized understanding and returned demonstration   HOME EXERCISE PROGRAM: Access Code: [email or web address] URL: [email or web address] Date: 12/09/2021 Prepared by: Candyce Churn  Exercises - Supine Shoulder Press with Dowel  - 2 x daily - 7 x weekly - 2 sets - 10 reps - Supine Shoulder Flexion with Dowel  - 1 x daily - 7 x weekly - 2 sets - 10 reps - 5-10 hold - Shoulder Extension with Resistance  - 1 x daily - 7 x weekly - 2 sets - 10 reps - Seated Scapular Retraction  - 5 x daily - 7 x weekly - 1 sets - 10 reps - Seated Single Arm Bicep Curls Supinated with Dumbbell  - 1 x daily - 7 x weekly - 2 sets - 10 reps - Prone Shoulder Extension - Single Arm  - 2 x daily - 7 x weekly - 2 sets - 10 reps - Prone Shoulder Row  - 2 x daily - 7 x weekly - 2 sets - 10 reps - Prone Single Arm Shoulder Horizontal Abduction with Scapular Retraction and Palm Down  - 2 x daily - 7 x weekly - 2 sets - 10 reps - Sidelying Shoulder External Rotation  - 2 x daily - 7 x weekly - 2 sets - 10 reps - Single Arm Serratus Punches in Supine with Dumbbell  - 2 x daily - 7 x weekly - 2 sets - 10 reps - Standing Shoulder Flexion to 90 Degrees with Dumbbells  - 2 x daily - 7 x weekly - 2 sets - 10 reps - Standing Shoulder Scaption  - 2 x daily - 7 x weekly - 2 sets - 10 repsAccess Code: [email or web address]  ASSESSMENT:  CLINICAL IMPRESSION: Pt continues to do well with progression of exercises for gentle strength and flexibility.   Pt is limiting use of Rt UE with lifting heavy objects at home.  Pt with some soreness with scaption ROM.  PT monitored for technique and tactile cues for scapular depression throughout session.  Patient will benefit from skilled PT to address the below impairments and  improve overall function.   OBJECTIVE IMPAIRMENTS decreased activity tolerance, decreased mobility, decreased ROM, decreased strength, increased muscle spasms, impaired flexibility, impaired UE functional use, postural dysfunction, and pain.   ACTIVITY LIMITATIONS carrying, lifting, bathing, dressing, and hygiene/grooming  PARTICIPATION LIMITATIONS: meal prep, cleaning, laundry, and yard work  PERSONAL FACTORS Time since onset of injury/illness/exacerbation and 1-2 comorbidities: Rt scapular fracture, vertigo/balance  are also affecting patient's functional outcome.   REHAB POTENTIAL: Good  CLINICAL DECISION MAKING: Stable/uncomplicated  EVALUATION COMPLEXITY: Low   GOALS: Goals reviewed with patient? Yes  SHORT TERM GOALS: Target date: 12/07/2021   Be independent in initial HEP Baseline: Goal status: MET (11/22/21)  2.  Demonstrate Rt shoulder A/ROM flexion to > or = to 80 degrees to improve self-care Baseline: 65 Goal status: INITIAL  3.  Report > or = to 70%  use of Rt UE with tasks below 90 degrees  Baseline: using >90 degrees, ~70%  Goal status: MET    LONG TERM GOALS: Target date: 02/07/22 Be independent in advanced HEP Baseline: advancement as needed now that restrictions are lifted (12/13/21) Goal status: In progress   2.  Improve FOTO to > or = to 65  Baseline: 64 (12/13/21) Goal status: In progress   3.  Demonstrate > or = to 135 degrees of Rt shoulder A/ROM flexion to improve ADLs  Baseline: 115 (12/13/21) Goal status: REVISED  4.  Demonstrate neutral posture to promote scapular healing and allow for return to restored Rt UE A/ROM Baseline: improved posture, but still working on this  (12/06/21) Goal status: in progress   5.  Demonstrate Rt shoulder IR to > or = to T10 to improve self care Baseline: L2 Goal status: INITIAL   6. Demonstrate > or = to 4+/5 to 5/5 Rt shoulder strength to improve endurance and safety for lifting   Baseline: 4/5    Goal  Status: INITIAL   PLAN: PT FREQUENCY: 1-2x/week  PT DURATION: 8 weeks  PLANNED INTERVENTIONS: Therapeutic exercises, Therapeutic activity, Neuromuscular re-education, Patient/Family education, Self Care, Joint mobilization, Aquatic Therapy, Dry Needling, Electrical stimulation, Cryotherapy, Moist heat, Taping, Ultrasound, Manual therapy, and Re-evaluation  PLAN FOR NEXT SESSION:  ROM progression, gentle strength and endurance as tolerated   Sigurd Sos, PT 12/20/21 11:48 AM   [facility] [address] [place], [place] [address] Phone # [phone] Fax [phone]

## 2021-12-22 ENCOUNTER — Ambulatory Visit: Payer: Managed Care, Other (non HMO)

## 2021-12-22 DIAGNOSIS — R293 Abnormal posture: Secondary | ICD-10-CM

## 2021-12-22 DIAGNOSIS — M25511 Pain in right shoulder: Secondary | ICD-10-CM | POA: Diagnosis not present

## 2021-12-22 DIAGNOSIS — M25611 Stiffness of right shoulder, not elsewhere classified: Secondary | ICD-10-CM

## 2021-12-22 DIAGNOSIS — G8929 Other chronic pain: Secondary | ICD-10-CM

## 2021-12-22 DIAGNOSIS — M6281 Muscle weakness (generalized): Secondary | ICD-10-CM

## 2021-12-22 NOTE — Therapy (Signed)
OUTPATIENT PHYSICAL THERAPY UPPER EXTREMITY TREATMENT NOTE   Patient Name: Keith Schroeder MRN: [id number] DOB:[birth year], 61 y.o., male Today's Date: 12/22/2021   PT End of Session - 12/22/21 1148     Visit Number 12    Date for PT Re-Evaluation 02/07/22    Authorization Type [employer]    PT Start Time 1108    PT Stop Time 1148    PT Time Calculation (min) 40 min    Activity Tolerance Patient tolerated treatment well    Behavior During Therapy WFL for tasks assessed/performed                    Past Medical History:  Diagnosis Date   Anxiety    GERD (gastroesophageal reflux disease)    Past Surgical History:  Procedure Laterality Date   CHOLECYSTECTOMY     Patient Active Problem List   Diagnosis Date Noted   TBI (traumatic brain injury) ([place]) 09/19/2021    PCP: Shirline Frees, MD   REFERRING PROVIDER: Esmond Plants, MD  REFERRING DIAG: [phone] (ICD-10-CM) - Displaced fracture of coracoid process, right shoulder, initial encounter for closed fracture  THERAPY DIAG:  Chronic right shoulder pain  Stiffness of right shoulder, not elsewhere classified  Abnormal posture  Muscle weakness (generalized)  Rationale for Evaluation and Treatment Rehabilitation  ONSET DATE: 09/19/21  SUBJECTIVE:                                                                                                                                                                                      SUBJECTIVE STATEMENT: My shoulder gets better everyday    Initial eval: Pt reports to PT with Rt scapular fracture sustained 09/19/21 when he fell through a floor of his attic.  Pt also sustained a skull fracture and L5 compression fracture. Pt had a 1 week stay in the hospital.  Pt sustained a contusion to frontal lobe.  Pt was in a sling after injury and now has instructions to not raise arm >90 degrees.    PERTINENT HISTORY: TBI, anxiety   PAIN:  Are you having pain? Yes: NPRS scale:  0/10 up to 3/10 with use against gravity.  Pain location: Rt arm, diffuse around shoulder  Pain description: sore/stiffness  Aggravating factors: use, trying to lift arm Relieving factors: rest  PRECAUTIONS: Other: per MD: gentle therapy, below shoulder only (<90 degrees), yellow theraband ok.    WEIGHT BEARING RESTRICTIONS No  FALLS:  Has patient fallen in last 6 months? No No loss of balance- fell in attic when balancing on truss  LIVING ENVIRONMENT: Lives with: lives with their spouse Lives in: House/apartment Stairs:  4 steps  to enter; 14 steps to basement Has following equipment at home: Shower bench and Grab bars   OCCUPATION: Retired   PLOF: [place] return to normal use of Rt UE, improve ROM and strength of Rt UE  OBJECTIVE:  DIAGNOSTIC FINDINGS:  MRI:  Early healing changes involving the fracture through the base of the acromion. Some early areas of bony ingrowth but no solid osseous bridging or significant callus formation. 2. No other fractures are identified. 3. Grossly by CT the rotator cuff tendons are intact.  PATIENT SURVEYS:  FOTO 44 (goal is 51) 12/06/21: 64 (goal is 70)  COGNITION:  Overall cognitive status: Within functional limits for tasks assessed     SENSATION: WFL  POSTURE: Forward head and rounded shoulder   UPPER EXTREMITY ROM:   Active ROM Right eval Right  11/22/21 Right 12/06/21 Right  12/13/21 Left eval  Shoulder flexion 65 (90 PROM) 88 A/ROM 90 with ease 115 Full throughout  Shoulder extension full      Shoulder abduction 80 (90 PROM)  90 with ease  105   Shoulder adduction       Shoulder internal rotation NT   L2   Shoulder external rotation NT   T2   Elbow flexion full      Elbow extension       Wrist flexion       Wrist extension       Wrist ulnar deviation       Wrist radial deviation       Wrist pronation       Wrist supination       (Blank rows = not tested)  UPPER EXTREMITY MMT:  MMT  Right eval Right  12/13/21 Left eval  Shoulder flexion Submax testing in neutral: 4+/5 4- 5/5 throughout  Shoulder extension Submax testing in neutral: 4+/5    Shoulder abduction  3+   Shoulder adduction     Shoulder internal rotation  4   Shoulder external rotation  4+   Middle trapezius     Lower trapezius     Elbow flexion 4+/5    Elbow extension     Wrist flexion     Wrist extension     Wrist ulnar deviation     Wrist radial deviation     Wrist pronation     Wrist supination     Grip strength (lbs)     (Blank rows = not tested)   PALPATION:  Palpable tenderness over Rt anterior glenohumeral joint.  Tension in Rt pecs, tender at axilla, anterior deltoid.  Limited joint mobility in all directions with significant guarding   TODAY'S TREATMENT:  Date: 12/22/21 UBE seated: level 2.2 x 8 min (4/4)- PT present to discuss progress  Supine chest press with 4# dumbbell added 2x10 Standing shoulder flexion and scaption with 1 lb 2 x 10 each right Finger ladder: flexion and abduction x 10 with gentle overpressure 1# added  Cone stack to 2nd shelf: 1# added to Rt 1 min then 2 min  ER with 1# sidelying 2x10  Manual: P/ROM and inferior glenohumeral joint mobs to Rt shoulder   Date: 12/20/21 UBE seated: level 2.2 x 8 min (4/4)- PT present to discuss progress  Supine chest press with 4# dumbbell added 2x10 Standing shoulder flexion and scaption with 1 lb 2 x 10 each right Finger ladder: flexion and abduction x 10 with gentle overpressure 1# added  Cone stack to 2nd shelf: 1# added to Rt 1  min then 2 min  ER with 1# sidelying 2x10  Manual: P/ROM and inferior glenohumeral joint mobs to Rt shoulder   Date: 12/15/21 UBE seated: level 2.2 x 8 min (4/4)- PT present to discuss progress  Supine chest press with 3# dumbbell added 2x10 Standing shoulder flexion and scaption with 1 lb 2 x 10 each right Finger ladder: flexion and abduction x 10 with gentle overpressure 1# added  Seated UE  ranger: flexion and horizontal abduction Rt x2 min each Overhead flexion 3# weight added x10 Manual: P/ROM and inferior glenohumeral joint mobs to Rt shoulder   PATIENT EDUCATION: Education details: Access Code: [email or web address] Person educated: Patient Education method: Explanation, Demonstration, and Handouts Education comprehension: verbalized understanding and returned demonstration   HOME EXERCISE PROGRAM: Access Code: [email or web address] URL: [email or web address] Date: 12/09/2021 Prepared by: Candyce Churn  Exercises - Supine Shoulder Press with Dowel  - 2 x daily - 7 x weekly - 2 sets - 10 reps - Supine Shoulder Flexion with Dowel  - 1 x daily - 7 x weekly - 2 sets - 10 reps - 5-10 hold - Shoulder Extension with Resistance  - 1 x daily - 7 x weekly - 2 sets - 10 reps - Seated Scapular Retraction  - 5 x daily - 7 x weekly - 1 sets - 10 reps - Seated Single Arm Bicep Curls Supinated with Dumbbell  - 1 x daily - 7 x weekly - 2 sets - 10 reps - Prone Shoulder Extension - Single Arm  - 2 x daily - 7 x weekly - 2 sets - 10 reps - Prone Shoulder Row  - 2 x daily - 7 x weekly - 2 sets - 10 reps - Prone Single Arm Shoulder Horizontal Abduction with Scapular Retraction and Palm Down  - 2 x daily - 7 x weekly - 2 sets - 10 reps - Sidelying Shoulder External Rotation  - 2 x daily - 7 x weekly - 2 sets - 10 reps - Single Arm Serratus Punches in Supine with Dumbbell  - 2 x daily - 7 x weekly - 2 sets - 10 reps - Standing Shoulder Flexion to 90 Degrees with Dumbbells  - 2 x daily - 7 x weekly - 2 sets - 10 reps - Standing Shoulder Scaption  - 2 x daily - 7 x weekly - 2 sets - 10 repsAccess Code: [email or web address]  ASSESSMENT:  CLINICAL IMPRESSION: Pt continues to do well with progression of exercises for strength and flexibility.   Pt experiences fatigue with endurance exercises.    Pt with some soreness with scaption ROM.  PT monitored for technique and tactile cues for scapular depression throughout  session.  Patient will benefit from skilled PT to address the below impairments and improve overall function.   OBJECTIVE IMPAIRMENTS decreased activity tolerance, decreased mobility, decreased ROM, decreased strength, increased muscle spasms, impaired flexibility, impaired UE functional use, postural dysfunction, and pain.   ACTIVITY LIMITATIONS carrying, lifting, bathing, dressing, and hygiene/grooming  PARTICIPATION LIMITATIONS: meal prep, cleaning, laundry, and yard work  PERSONAL FACTORS Time since onset of injury/illness/exacerbation and 1-2 comorbidities: Rt scapular fracture, vertigo/balance  are also affecting patient's functional outcome.   REHAB POTENTIAL: Good  CLINICAL DECISION MAKING: Stable/uncomplicated  EVALUATION COMPLEXITY: Low   GOALS: Goals reviewed with patient? Yes  SHORT TERM GOALS: Target date: 12/07/2021   Be independent in initial HEP Baseline: Goal status: MET (11/22/21)  2.  Demonstrate Rt shoulder A/ROM flexion to > or = to 80 degrees  to improve self-care Baseline: 65 Goal status: INITIAL  3.  Report > or = to 70% use of Rt UE with tasks below 90 degrees  Baseline: using >90 degrees, ~70%  Goal status: MET    LONG TERM GOALS: Target date: 02/07/22 Be independent in advanced HEP Baseline: advancement as needed now that restrictions are lifted (12/13/21) Goal status: In progress   2.  Improve FOTO to > or = to 65  Baseline: 64 (12/13/21) Goal status: In progress   3.  Demonstrate > or = to 135 degrees of Rt shoulder A/ROM flexion to improve ADLs  Baseline: 115 (12/13/21) Goal status: REVISED  4.  Demonstrate neutral posture to promote scapular healing and allow for return to restored Rt UE A/ROM Baseline: improved posture, but still working on this  (12/06/21) Goal status: in progress   5.  Demonstrate Rt shoulder IR to > or = to T10 to improve self care Baseline: L2 Goal status: INITIAL   6. Demonstrate > or = to 4+/5 to 5/5 Rt shoulder  strength to improve endurance and safety for lifting   Baseline: 4/5    Goal Status: INITIAL   PLAN: PT FREQUENCY: 1-2x/week  PT DURATION: 8 weeks  PLANNED INTERVENTIONS: Therapeutic exercises, Therapeutic activity, Neuromuscular re-education, Patient/Family education, Self Care, Joint mobilization, Aquatic Therapy, Dry Needling, Electrical stimulation, Cryotherapy, Moist heat, Taping, Ultrasound, Manual therapy, and Re-evaluation  PLAN FOR NEXT SESSION:  ROM progression, gentle strength and endurance as tolerated   Sigurd Sos, PT 12/22/21 11:55 AM   [facility] [address] [place], [place] [address] Phone # [phone] Fax [phone]

## 2022-01-03 ENCOUNTER — Ambulatory Visit: Payer: Managed Care, Other (non HMO)

## 2022-01-03 DIAGNOSIS — R293 Abnormal posture: Secondary | ICD-10-CM

## 2022-01-03 DIAGNOSIS — M6281 Muscle weakness (generalized): Secondary | ICD-10-CM

## 2022-01-03 DIAGNOSIS — G8929 Other chronic pain: Secondary | ICD-10-CM

## 2022-01-03 DIAGNOSIS — M25511 Pain in right shoulder: Secondary | ICD-10-CM | POA: Diagnosis not present

## 2022-01-03 DIAGNOSIS — M25611 Stiffness of right shoulder, not elsewhere classified: Secondary | ICD-10-CM

## 2022-01-03 NOTE — Therapy (Signed)
OUTPATIENT PHYSICAL THERAPY UPPER EXTREMITY TREATMENT NOTE   Patient Name: Keith Schroeder MRN: [id number] DOB:Nov 25, 1960, 61 y.o., male Today's Date: 01/03/2022   PT End of Session - 01/03/22 1116     Visit Number 13    Date for PT Re-Evaluation 02/07/22    Authorization Type [employer]    PT Start Time 1108    PT Stop Time 1147    PT Time Calculation (min) 39 min    Activity Tolerance Patient tolerated treatment well    Behavior During Therapy WFL for tasks assessed/performed                    Past Medical History:  Diagnosis Date   Anxiety    GERD (gastroesophageal reflux disease)    Past Surgical History:  Procedure Laterality Date   CHOLECYSTECTOMY     Patient Active Problem List   Diagnosis Date Noted   TBI (traumatic brain injury) ([place]) 09/19/2021    PCP: Shirline Frees, MD   REFERRING PROVIDER: Esmond Plants, MD  REFERRING DIAG: [phone] (ICD-10-CM) - Displaced fracture of coracoid process, right shoulder, initial encounter for closed fracture  THERAPY DIAG:  Chronic right shoulder pain  Stiffness of right shoulder, not elsewhere classified  Abnormal posture  Muscle weakness (generalized)  Rationale for Evaluation and Treatment Rehabilitation  ONSET DATE: 09/19/21  SUBJECTIVE:                                                                                                                                                                                      SUBJECTIVE STATEMENT: No new issues or problems.  Doing my exercises daily.   Initial eval: Pt reports to PT with Rt scapular fracture sustained 09/19/21 when he fell through a floor of his attic.  Pt also sustained a skull fracture and L5 compression fracture. Pt had a 1 week stay in the hospital.  Pt sustained a contusion to frontal lobe.  Pt was in a sling after injury and now has instructions to not raise arm >90 degrees.    PERTINENT HISTORY: TBI, anxiety   PAIN:  Are you having  pain? Yes: NPRS scale: 0/10 up to 3/10 with use against gravity.  Pain location: Rt arm, diffuse around shoulder  Pain description: sore/stiffness  Aggravating factors: use, trying to lift arm Relieving factors: rest  PRECAUTIONS: Other: per MD: gentle therapy, below shoulder only (<90 degrees), yellow theraband ok.    WEIGHT BEARING RESTRICTIONS No  FALLS:  Has patient fallen in last 6 months? No No loss of balance- fell in attic when balancing on truss  LIVING ENVIRONMENT: Lives with: lives with their spouse Lives in: House/apartment  Stairs:  4 steps to enter; 14 steps to basement Has following equipment at home: Shower bench and Grab bars   OCCUPATION: Retired   PLOF: [place] return to normal use of Rt UE, improve ROM and strength of Rt UE  OBJECTIVE:  DIAGNOSTIC FINDINGS:  MRI:  Early healing changes involving the fracture through the base of the acromion. Some early areas of bony ingrowth but no solid osseous bridging or significant callus formation. 2. No other fractures are identified. 3. Grossly by CT the rotator cuff tendons are intact.  PATIENT SURVEYS:  FOTO 44 (goal is 95) 12/06/21: 64 (goal is 65)  COGNITION:  Overall cognitive status: Within functional limits for tasks assessed     SENSATION: WFL  POSTURE: Forward head and rounded shoulder   UPPER EXTREMITY ROM:   Active ROM Right eval Right  11/22/21 Right 12/06/21 Right  12/13/21 Left eval  Shoulder flexion 65 (90 PROM) 88 A/ROM 90 with ease 115 Full throughout  Shoulder extension full      Shoulder abduction 80 (90 PROM)  90 with ease  105   Shoulder adduction       Shoulder internal rotation NT   L2   Shoulder external rotation NT   T2   Elbow flexion full      Elbow extension       Wrist flexion       Wrist extension       Wrist ulnar deviation       Wrist radial deviation       Wrist pronation       Wrist supination       (Blank rows = not tested)  UPPER  EXTREMITY MMT:  MMT Right eval Right  12/13/21 Left eval  Shoulder flexion Submax testing in neutral: 4+/5 4- 5/5 throughout  Shoulder extension Submax testing in neutral: 4+/5    Shoulder abduction  3+   Shoulder adduction     Shoulder internal rotation  4   Shoulder external rotation  4+   Middle trapezius     Lower trapezius     Elbow flexion 4+/5    Elbow extension     Wrist flexion     Wrist extension     Wrist ulnar deviation     Wrist radial deviation     Wrist pronation     Wrist supination     Grip strength (lbs)     (Blank rows = not tested)   PALPATION:  Palpable tenderness over Rt anterior glenohumeral joint.  Tension in Rt pecs, tender at axilla, anterior deltoid.  Limited joint mobility in all directions with significant guarding   TODAY'S TREATMENT:  Date: 01/03/22 UBE seated: level 2.2 x 8 min (4/4)- PT present to discuss progress  Standing shoulder flexion and scaption with 1 lb 2 x 10 each right Cone stack to 2nd shelf: 1.5 # wrist weight to Rt  2 min Fwd bent right shoulder ext, rows and horizontal abd with 2lb x 20 each Side lying ER with 2 lb x 20 Supine chest press with 4# dumbbell added 2x10 Manual: P/ROM to Rt shoulder   Date: 12/22/21 UBE seated: level 2.2 x 8 min (4/4)- PT present to discuss progress  Supine chest press with 4# dumbbell added 2x10 Standing shoulder flexion and scaption with 1 lb 2 x 10 each right Finger ladder: flexion and abduction x 10 with gentle overpressure 1# added  Cone stack to 2nd shelf: 1# added to Rt  1 min then 2 min  ER with 1# sidelying 2x10  Manual: P/ROM and inferior glenohumeral joint mobs to Rt shoulder   Date: 12/20/21 UBE seated: level 2.2 x 8 min (4/4)- PT present to discuss progress  Supine chest press with 4# dumbbell added 2x10 Standing shoulder flexion and scaption with 1 lb 2 x 10 each right Finger ladder: flexion and abduction x 10 with gentle overpressure 1# added  Cone stack to 2nd shelf: 1#  added to Rt 1 min then 2 min  ER with 1# sidelying 2x10  Manual: P/ROM and inferior glenohumeral joint mobs to Rt shoulder     PATIENT EDUCATION: Education details: Access Code: [email or web address] Person educated: Patient Education method: Explanation, Demonstration, and Handouts Education comprehension: verbalized understanding and returned demonstration   HOME EXERCISE PROGRAM: Access Code: [email or web address] URL: [email or web address] Date: 12/09/2021 Prepared by: Candyce Churn  Exercises - Supine Shoulder Press with Dowel  - 2 x daily - 7 x weekly - 2 sets - 10 reps - Supine Shoulder Flexion with Dowel  - 1 x daily - 7 x weekly - 2 sets - 10 reps - 5-10 hold - Shoulder Extension with Resistance  - 1 x daily - 7 x weekly - 2 sets - 10 reps - Seated Scapular Retraction  - 5 x daily - 7 x weekly - 1 sets - 10 reps - Seated Single Arm Bicep Curls Supinated with Dumbbell  - 1 x daily - 7 x weekly - 2 sets - 10 reps - Prone Shoulder Extension - Single Arm  - 2 x daily - 7 x weekly - 2 sets - 10 reps - Prone Shoulder Row  - 2 x daily - 7 x weekly - 2 sets - 10 reps - Prone Single Arm Shoulder Horizontal Abduction with Scapular Retraction and Palm Down  - 2 x daily - 7 x weekly - 2 sets - 10 reps - Sidelying Shoulder External Rotation  - 2 x daily - 7 x weekly - 2 sets - 10 reps - Single Arm Serratus Punches in Supine with Dumbbell  - 2 x daily - 7 x weekly - 2 sets - 10 reps - Standing Shoulder Flexion to 90 Degrees with Dumbbells  - 2 x daily - 7 x weekly - 2 sets - 10 reps - Standing Shoulder Scaption  - 2 x daily - 7 x weekly - 2 sets - 10 repsAccess Code: [email or web address]  ASSESSMENT:  CLINICAL IMPRESSION: Leticia is progressing appropriately.  He continues to have some pain at end ranges during PROM but is making steady gains.  He is compliant and well motivated  Patient will benefit from skilled PT to address the below impairments and improve overall function.   OBJECTIVE IMPAIRMENTS  decreased activity tolerance, decreased mobility, decreased ROM, decreased strength, increased muscle spasms, impaired flexibility, impaired UE functional use, postural dysfunction, and pain.   ACTIVITY LIMITATIONS carrying, lifting, bathing, dressing, and hygiene/grooming  PARTICIPATION LIMITATIONS: meal prep, cleaning, laundry, and yard work  PERSONAL FACTORS Time since onset of injury/illness/exacerbation and 1-2 comorbidities: Rt scapular fracture, vertigo/balance  are also affecting patient's functional outcome.   REHAB POTENTIAL: Good  CLINICAL DECISION MAKING: Stable/uncomplicated  EVALUATION COMPLEXITY: Low   GOALS: Goals reviewed with patient? Yes  SHORT TERM GOALS: Target date: 12/07/2021   Be independent in initial HEP Baseline: Goal status: MET (11/22/21)  2.  Demonstrate Rt shoulder A/ROM flexion to > or = to 80 degrees to improve self-care Baseline: 65 Goal status: INITIAL  3.  Report > or = to 70% use of Rt UE with tasks below 90 degrees  Baseline: using >90 degrees, ~70%  Goal status: MET    LONG TERM GOALS: Target date: 02/07/22 Be independent in advanced HEP Baseline: advancement as needed now that restrictions are lifted (12/13/21) Goal status: In progress   2.  Improve FOTO to > or = to 65  Baseline: 64 (12/13/21) Goal status: In progress   3.  Demonstrate > or = to 135 degrees of Rt shoulder A/ROM flexion to improve ADLs  Baseline: 115 (12/13/21) Goal status: REVISED  4.  Demonstrate neutral posture to promote scapular healing and allow for return to restored Rt UE A/ROM Baseline: improved posture, but still working on this  (12/06/21) Goal status: in progress   5.  Demonstrate Rt shoulder IR to > or = to T10 to improve self care Baseline: L2 Goal status: INITIAL   6. Demonstrate > or = to 4+/5 to 5/5 Rt shoulder strength to improve endurance and safety for lifting   Baseline: 4/5    Goal Status: INITIAL   PLAN: PT FREQUENCY: 1-2x/week  PT  DURATION: 8 weeks  PLANNED INTERVENTIONS: Therapeutic exercises, Therapeutic activity, Neuromuscular re-education, Patient/Family education, Self Care, Joint mobilization, Aquatic Therapy, Dry Needling, Electrical stimulation, Cryotherapy, Moist heat, Taping, Ultrasound, Manual therapy, and Re-evaluation  PLAN FOR NEXT SESSION:  ROM progression, gentle strength and endurance as tolerated   Anderson Malta B. Jalana Moore, PT 01/03/22 11:14 PM   [facility] [address], [address] [place], [place] [address] Phone # [phone] Fax [phone]

## 2022-01-10 ENCOUNTER — Ambulatory Visit: Payer: Managed Care, Other (non HMO)

## 2022-01-12 ENCOUNTER — Ambulatory Visit: Payer: Managed Care, Other (non HMO) | Attending: Orthopedic Surgery

## 2022-01-12 DIAGNOSIS — M6281 Muscle weakness (generalized): Secondary | ICD-10-CM | POA: Diagnosis present

## 2022-01-12 DIAGNOSIS — M25611 Stiffness of right shoulder, not elsewhere classified: Secondary | ICD-10-CM

## 2022-01-12 DIAGNOSIS — M25511 Pain in right shoulder: Secondary | ICD-10-CM | POA: Insufficient documentation

## 2022-01-12 DIAGNOSIS — R293 Abnormal posture: Secondary | ICD-10-CM

## 2022-01-12 DIAGNOSIS — G8929 Other chronic pain: Secondary | ICD-10-CM | POA: Diagnosis present

## 2022-01-12 NOTE — Therapy (Signed)
OUTPATIENT PHYSICAL THERAPY UPPER EXTREMITY TREATMENT NOTE   Patient Name: Keith Schroeder MRN: [id number] DOB:12-03-1960, 61 y.o., male Today's Date: 01/12/2022   PT End of Session - 01/12/22 1226     Visit Number 14    Date for PT Re-Evaluation 02/07/22    Authorization Type [employer]    PT Start Time 1146    PT Stop Time 1226    PT Time Calculation (min) 40 min    Activity Tolerance Patient tolerated treatment well    Behavior During Therapy WFL for tasks assessed/performed                      Past Medical History:  Diagnosis Date   Anxiety    GERD (gastroesophageal reflux disease)    Past Surgical History:  Procedure Laterality Date   CHOLECYSTECTOMY     Patient Active Problem List   Diagnosis Date Noted   TBI (traumatic brain injury) ([place]) 09/19/2021    PCP: Shirline Frees, MD   REFERRING PROVIDER: Esmond Plants, MD  REFERRING DIAG: [phone] (ICD-10-CM) - Displaced fracture of coracoid process, right shoulder, initial encounter for closed fracture  THERAPY DIAG:  Chronic right shoulder pain  Stiffness of right shoulder, not elsewhere classified  Abnormal posture  Muscle weakness (generalized)  Rationale for Evaluation and Treatment Rehabilitation  ONSET DATE: 09/19/21  SUBJECTIVE:                                                                                                                                                                                      SUBJECTIVE STATEMENT: I saw the MD and he did an x-ray and my scapula was healed. I will have an MRI just to rule out any soft tissue damage.    Initial eval: Pt reports to PT with Rt scapular fracture sustained 09/19/21 when he fell through a floor of his attic.  Pt also sustained a skull fracture and L5 compression fracture. Pt had a 1 week stay in the hospital.  Pt sustained a contusion to frontal lobe.  Pt was in a sling after injury and now has instructions to not raise arm >90 degrees.     PERTINENT HISTORY: TBI, anxiety   PAIN:  Are you having pain? Yes: NPRS scale: 0/10 up to 3/10 with use against gravity.  Pain location: Rt arm, diffuse around shoulder  Pain description: sore/stiffness  Aggravating factors: use, trying to lift arm Relieving factors: rest  PRECAUTIONS: Other: per MD: gentle therapy, below shoulder only (<90 degrees), yellow theraband ok.    WEIGHT BEARING RESTRICTIONS No  FALLS:  Has patient fallen in last 6 months? No No loss of  balance- fell in attic when balancing on truss  LIVING ENVIRONMENT: Lives with: lives with their spouse Lives in: House/apartment Stairs:  4 steps to enter; 14 steps to basement Has following equipment at home: Electronics engineer and Grab bars   OCCUPATION: Retired   PLOF: Independent  PATIENT GOALS return to normal use of Rt UE, improve ROM and strength of Rt UE  OBJECTIVE:  DIAGNOSTIC FINDINGS:  MRI:  Early healing changes involving the fracture through the base of the acromion. Some early areas of bony ingrowth but no solid osseous bridging or significant callus formation. 2. No other fractures are identified. 3. Grossly by CT the rotator cuff tendons are intact.  PATIENT SURVEYS:  FOTO 44 (goal is 29) 12/06/21: 64 (goal is 54)  COGNITION:  Overall cognitive status: Within functional limits for tasks assessed     SENSATION: WFL  POSTURE: Forward head and rounded shoulder   UPPER EXTREMITY ROM:   Active ROM Right eval Right  11/22/21 Right 12/06/21 Right  12/13/21 Right  01/12/22 Left eval  Shoulder flexion 65 (90 PROM) 88 A/ROM 90 with ease 115 120 Full throughout  Shoulder extension full       Shoulder abduction 80 (90 PROM)  90 with ease  105    Shoulder adduction        Shoulder internal rotation NT   L2 T10   Shoulder external rotation NT   T2    Elbow flexion full       Elbow extension        Wrist flexion        Wrist extension        Wrist ulnar deviation        Wrist radial  deviation        Wrist pronation        Wrist supination        (Blank rows = not tested)  UPPER EXTREMITY MMT:  MMT Right eval Right  12/13/21 Left eval  Shoulder flexion Submax testing in neutral: 4+/5 4- 5/5 throughout  Shoulder extension Submax testing in neutral: 4+/5    Shoulder abduction  3+   Shoulder adduction     Shoulder internal rotation  4   Shoulder external rotation  4+   Middle trapezius     Lower trapezius     Elbow flexion 4+/5    Elbow extension     Wrist flexion     Wrist extension     Wrist ulnar deviation     Wrist radial deviation     Wrist pronation     Wrist supination     Grip strength (lbs)     (Blank rows = not tested)   PALPATION:  Palpable tenderness over Rt anterior glenohumeral joint.  Tension in Rt pecs, tender at axilla, anterior deltoid.  Limited joint mobility in all directions with significant guarding   TODAY'S TREATMENT:  Date: 01/12/22 UBE seated: level 2.2 x 8 min (4/4)- PT present to discuss progress  Standing shoulder flexion and scaption with 1 lb 2 x 10 each right Wall push-ups 2x10 Rows with cable weights 15# 2x10 Side lying ER with 3  lb x 20 Supine chest press with 5# dumbbell added 2x10 Sidelying abduction 2# 2x10 (attempted 3# but had pain with eccentric motion) Date: 01/03/22 UBE seated: level 2.2 x 8 min (4/4)- PT present to discuss progress  Standing shoulder flexion and scaption with 1 lb 2 x 10 each right Cone stack to 2nd shelf: 1.5 #  wrist weight to Rt  2 min Fwd bent right shoulder ext, rows and horizontal abd with 2lb x 20 each Ball circles on wall: Rt CW/CCW 2 x 10 each  Side lying ER with 2 lb x 20 Supine chest press with 4# dumbbell added 2x10 Manual: P/ROM to Rt shoulder   Date: 12/22/21 UBE seated: level 2.2 x 8 min (4/4)- PT present to discuss progress  Supine chest press with 4# dumbbell added 2x10 Standing shoulder flexion and scaption with 1 lb 2 x 10 each right Finger ladder: flexion and  abduction x 10 with gentle overpressure 1# added  Cone stack to 2nd shelf: 1# added to Rt 1 min then 2 min  ER with 1# sidelying 2x10  Manual: P/ROM and inferior glenohumeral joint mobs to Rt shoulder    PATIENT EDUCATION: Education details: Access Code: [email or web address] Person educated: Patient Education method: Explanation, Demonstration, and Handouts Education comprehension: verbalized understanding and returned demonstration   HOME EXERCISE PROGRAM: Access Code: [email or web address] URL: [email or web address] Date: 12/09/2021 Prepared by: Candyce Churn  Exercises - Supine Shoulder Press with Dowel  - 2 x daily - 7 x weekly - 2 sets - 10 reps - Supine Shoulder Flexion with Dowel  - 1 x daily - 7 x weekly - 2 sets - 10 reps - 5-10 hold - Shoulder Extension with Resistance  - 1 x daily - 7 x weekly - 2 sets - 10 reps - Seated Scapular Retraction  - 5 x daily - 7 x weekly - 1 sets - 10 reps - Seated Single Arm Bicep Curls Supinated with Dumbbell  - 1 x daily - 7 x weekly - 2 sets - 10 reps - Prone Shoulder Extension - Single Arm  - 2 x daily - 7 x weekly - 2 sets - 10 reps - Prone Shoulder Row  - 2 x daily - 7 x weekly - 2 sets - 10 reps - Prone Single Arm Shoulder Horizontal Abduction with Scapular Retraction and Palm Down  - 2 x daily - 7 x weekly - 2 sets - 10 reps - Sidelying Shoulder External Rotation  - 2 x daily - 7 x weekly - 2 sets - 10 reps - Single Arm Serratus Punches in Supine with Dumbbell  - 2 x daily - 7 x weekly - 2 sets - 10 reps - Standing Shoulder Flexion to 90 Degrees with Dumbbells  - 2 x daily - 7 x weekly - 2 sets - 10 reps - Standing Shoulder Scaption  - 2 x daily - 7 x weekly - 2 sets - 10 repsAccess Code: [email or web address]  ASSESSMENT:  CLINICAL IMPRESSION: Pt saw MD x-ray showed the fracture is healed.  He will have MRI at the end of December to rule out any soft tissue damage.  Pt tolerated advancement of exercises in the clinic today.  No increase in pain with  activity today.   Patient will benefit from skilled PT to address the below impairments and improve overall function.   OBJECTIVE IMPAIRMENTS decreased activity tolerance, decreased mobility, decreased ROM, decreased strength, increased muscle spasms, impaired flexibility, impaired UE functional use, postural dysfunction, and pain.   ACTIVITY LIMITATIONS carrying, lifting, bathing, dressing, and hygiene/grooming  PARTICIPATION LIMITATIONS: meal prep, cleaning, laundry, and yard work  PERSONAL FACTORS Time since onset of injury/illness/exacerbation and 1-2 comorbidities: Rt scapular fracture, vertigo/balance  are also affecting patient's functional outcome.   REHAB POTENTIAL: Good  CLINICAL DECISION MAKING: Stable/uncomplicated  EVALUATION COMPLEXITY: Low   GOALS: Goals  reviewed with patient? Yes  SHORT TERM GOALS: Target date: 12/07/2021   Be independent in initial HEP Baseline: Goal status: MET (11/22/21)  2.  Demonstrate Rt shoulder A/ROM flexion to > or = to 80 degrees to improve self-care Baseline:  Goal status: MET  3.  Report > or = to 70% use of Rt UE with tasks below 90 degrees  Baseline: using >90 degrees, ~70%  Goal status: MET    LONG TERM GOALS: Target date: 02/07/22 Be independent in advanced HEP Baseline: advancement as needed now that restrictions are lifted (12/13/21) Goal status: In progress   2.  Improve FOTO to > or = to 65  Baseline: 64 (12/13/21) Goal status: In progress   3.  Demonstrate > or = to 135 degrees of Rt shoulder A/ROM flexion to improve ADLs  Baseline: 115 (12/13/21) Goal status: REVISED  4.  Demonstrate neutral posture to promote scapular healing and allow for return to restored Rt UE A/ROM Baseline: improved posture, but still working on this  (12/06/21) Goal status: in progress   5.  Demonstrate Rt shoulder IR to > or = to T10 to improve self care Baseline: L2 Goal status: INITIAL   6. Demonstrate > or = to 4+/5 to 5/5 Rt shoulder  strength to improve endurance and safety for lifting   Baseline: 3+ to 4+    Goal Status: INITIAL   PLAN: PT FREQUENCY: 1-2x/week  PT DURATION: 8 weeks  PLANNED INTERVENTIONS: Therapeutic exercises, Therapeutic activity, Neuromuscular re-education, Patient/Family education, Self Care, Joint mobilization, Aquatic Therapy, Dry Needling, Electrical stimulation, Cryotherapy, Moist heat, Taping, Ultrasound, Manual therapy, and Re-evaluation  PLAN FOR NEXT SESSION:  ROM progression, gentle strength and endurance as tolerated   Sigurd Sos, PT 01/12/22 12:30 PM  [facility] [address] [place], [place] [address] Phone # [phone] Fax [phone]

## 2022-01-16 ENCOUNTER — Ambulatory Visit: Payer: Managed Care, Other (non HMO) | Admitting: Physical Therapy

## 2022-01-16 ENCOUNTER — Encounter: Payer: Self-pay | Admitting: Physical Therapy

## 2022-01-16 DIAGNOSIS — M25611 Stiffness of right shoulder, not elsewhere classified: Secondary | ICD-10-CM

## 2022-01-16 DIAGNOSIS — M25511 Pain in right shoulder: Secondary | ICD-10-CM | POA: Diagnosis not present

## 2022-01-16 DIAGNOSIS — M6281 Muscle weakness (generalized): Secondary | ICD-10-CM

## 2022-01-16 DIAGNOSIS — G8929 Other chronic pain: Secondary | ICD-10-CM

## 2022-01-16 DIAGNOSIS — R293 Abnormal posture: Secondary | ICD-10-CM

## 2022-01-16 NOTE — Therapy (Signed)
OUTPATIENT PHYSICAL THERAPY UPPER EXTREMITY TREATMENT NOTE   Patient Name: Keith Schroeder MRN: [id number] DOB:10/15/1960, 61 y.o., male Today's Date: 01/16/2022   PT End of Session - 01/16/22 1152     Visit Number 15    Date for PT Re-Evaluation 02/07/22    Authorization Type [employer]    PT Start Time 1150    PT Stop Time 1228    PT Time Calculation (min) 38 min    Activity Tolerance Patient tolerated treatment well    Behavior During Therapy WFL for tasks assessed/performed                       Past Medical History:  Diagnosis Date   Anxiety    GERD (gastroesophageal reflux disease)    Past Surgical History:  Procedure Laterality Date   CHOLECYSTECTOMY     Patient Active Problem List   Diagnosis Date Noted   TBI (traumatic brain injury) ([place]) 09/19/2021    PCP: Shirline Frees, MD   REFERRING PROVIDER: Esmond Plants, MD  REFERRING DIAG: [phone] (ICD-10-CM) - Displaced fracture of coracoid process, right shoulder, initial encounter for closed fracture  THERAPY DIAG:  Chronic right shoulder pain  Stiffness of right shoulder, not elsewhere classified  Abnormal posture  Muscle weakness (generalized)  Rationale for Evaluation and Treatment Rehabilitation  ONSET DATE: 09/19/21  SUBJECTIVE:                                                                                                                                                                                      SUBJECTIVE STATEMENT: If I put my arm in a certain position I can find some pain.  I am way better than where I was.   Initial eval: Pt reports to PT with Rt scapular fracture sustained 09/19/21 when he fell through a floor of his attic.  Pt also sustained a skull fracture and L5 compression fracture. Pt had a 1 week stay in the hospital.  Pt sustained a contusion to frontal lobe.  Pt was in a sling after injury and now has instructions to not raise arm >90 degrees.    PERTINENT  HISTORY: TBI, anxiety   PAIN:  Are you having pain? Yes: NPRS scale: 0/10 up to 3/10 with use against gravity.  Pain location: Rt arm, diffuse around shoulder  Pain description: sore/stiffness  Aggravating factors: use, trying to lift arm Relieving factors: rest  PRECAUTIONS: Other: per MD: gentle therapy, below shoulder only (<90 degrees), yellow theraband ok.    WEIGHT BEARING RESTRICTIONS No  FALLS:  Has patient fallen in last 6 months? No No loss of balance- fell in attic  when balancing on truss  LIVING ENVIRONMENT: Lives with: lives with their spouse Lives in: House/apartment Stairs:  4 steps to enter; 14 steps to basement Has following equipment at home: Electronics engineer and Grab bars   OCCUPATION: Retired   PLOF: Independent  PATIENT GOALS return to normal use of Rt UE, improve ROM and strength of Rt UE  OBJECTIVE:  DIAGNOSTIC FINDINGS:  MRI:  Early healing changes involving the fracture through the base of the acromion. Some early areas of bony ingrowth but no solid osseous bridging or significant callus formation. 2. No other fractures are identified. 3. Grossly by CT the rotator cuff tendons are intact.  PATIENT SURVEYS:  FOTO 44 (goal is 19) 12/06/21: 64 (goal is 59)  COGNITION:  Overall cognitive status: Within functional limits for tasks assessed     SENSATION: WFL  POSTURE: Forward head and rounded shoulder   UPPER EXTREMITY ROM:   Active ROM Right eval Right  11/22/21 Right 12/06/21 Right  12/13/21 Right  01/12/22 Left eval  Shoulder flexion 65 (90 PROM) 88 A/ROM 90 with ease 115 120 Full throughout  Shoulder extension full       Shoulder abduction 80 (90 PROM)  90 with ease  105    Shoulder adduction        Shoulder internal rotation NT   L2 T10   Shoulder external rotation NT   T2    Elbow flexion full       Elbow extension        Wrist flexion        Wrist extension        Wrist ulnar deviation        Wrist radial deviation         Wrist pronation        Wrist supination        (Blank rows = not tested)  UPPER EXTREMITY MMT:  MMT Right eval Right  12/13/21 Left eval  Shoulder flexion Submax testing in neutral: 4+/5 4- 5/5 throughout  Shoulder extension Submax testing in neutral: 4+/5    Shoulder abduction  3+   Shoulder adduction     Shoulder internal rotation  4   Shoulder external rotation  4+   Middle trapezius     Lower trapezius     Elbow flexion 4+/5    Elbow extension     Wrist flexion     Wrist extension     Wrist ulnar deviation     Wrist radial deviation     Wrist pronation     Wrist supination     Grip strength (lbs)     (Blank rows = not tested)   PALPATION:  Palpable tenderness over Rt anterior glenohumeral joint.  Tension in Rt pecs, tender at axilla, anterior deltoid.  Limited joint mobility in all directions with significant guarding   TODAY'S TREATMENT:  Date: 01/16/22 NuStep L7 x 3' while waiting for UBE UBE seated L2.5 x4 min (2/2), L2.2 x4 min (2/2), PT present to discuss status Supine chest press with 5# dumbbell added 2x10 Sidelying ER with 3  lb 2x10 Sidelying abduction 2# 2x10 Rows with cable weights 15# 2x10 Wall push-ups 2x10 Standing shoulder flexion and scaption with 1 lb 1 x 10 each right - PT TC and manual facilitation of scapular depression with upward rotation for final range each rep   Date: 01/12/22 UBE seated: level 2.2 x 8 min (4/4)- PT present to discuss progress  Standing shoulder flexion and scaption  with 1 lb 2 x 10 each right Wall push-ups 2x10 Rows with cable weights 15# 2x10 Side lying ER with 3  lb x 20 Supine chest press with 5# dumbbell added 2x10 Sidelying abduction 2# 2x10 (attempted 3# but had pain with eccentric motion) Date: 01/03/22 UBE seated: level 2.2 x 8 min (4/4)- PT present to discuss progress  Standing shoulder flexion and scaption with 1 lb 2 x 10 each right Cone stack to 2nd shelf: 1.5 # wrist weight to Rt  2 min Fwd bent right  shoulder ext, rows and horizontal abd with 2lb x 20 each Ball circles on wall: Rt CW/CCW 2 x 10 each  Side lying ER with 2 lb x 20 Supine chest press with 4# dumbbell added 2x10 Manual: P/ROM to Rt shoulder    PATIENT EDUCATION: Education details: Access Code: [email or web address] Person educated: Patient Education method: Explanation, Demonstration, and Handouts Education comprehension: verbalized understanding and returned demonstration   HOME EXERCISE PROGRAM: Access Code: [email or web address] URL: [email or web address] Date: 12/09/2021 Prepared by: Candyce Churn  Exercises - Supine Shoulder Press with Dowel  - 2 x daily - 7 x weekly - 2 sets - 10 reps - Supine Shoulder Flexion with Dowel  - 1 x daily - 7 x weekly - 2 sets - 10 reps - 5-10 hold - Shoulder Extension with Resistance  - 1 x daily - 7 x weekly - 2 sets - 10 reps - Seated Scapular Retraction  - 5 x daily - 7 x weekly - 1 sets - 10 reps - Seated Single Arm Bicep Curls Supinated with Dumbbell  - 1 x daily - 7 x weekly - 2 sets - 10 reps - Prone Shoulder Extension - Single Arm  - 2 x daily - 7 x weekly - 2 sets - 10 reps - Prone Shoulder Row  - 2 x daily - 7 x weekly - 2 sets - 10 reps - Prone Single Arm Shoulder Horizontal Abduction with Scapular Retraction and Palm Down  - 2 x daily - 7 x weekly - 2 sets - 10 reps - Sidelying Shoulder External Rotation  - 2 x daily - 7 x weekly - 2 sets - 10 reps - Single Arm Serratus Punches in Supine with Dumbbell  - 2 x daily - 7 x weekly - 2 sets - 10 reps - Standing Shoulder Flexion to 90 Degrees with Dumbbells  - 2 x daily - 7 x weekly - 2 sets - 10 reps - Standing Shoulder Scaption  - 2 x daily - 7 x weekly - 2 sets - 10 repsAccess Code: [email or web address]  ASSESSMENT:  CLINICAL IMPRESSION: Pt saw MD x-ray showed the fracture is healed.  He will have MRI at the end of December to rule out any soft tissue damage.  Pt grows fatigued during 2nd set of SL abd and ER at current weight challenge but  was able to perform final reps with good form.  Pt gets painful arc of motion with standing 1lb flexion which improved with manual facilitation of scapular depression and upward rotation.  Patient will benefit from skilled PT to address the below impairments and improve overall function.   OBJECTIVE IMPAIRMENTS decreased activity tolerance, decreased mobility, decreased ROM, decreased strength, increased muscle spasms, impaired flexibility, impaired UE functional use, postural dysfunction, and pain.   ACTIVITY LIMITATIONS carrying, lifting, bathing, dressing, and hygiene/grooming  PARTICIPATION LIMITATIONS: meal prep, cleaning, laundry, and yard work  PERSONAL FACTORS Time since onset of injury/illness/exacerbation and 1-2 comorbidities: Rt  scapular fracture, vertigo/balance  are also affecting patient's functional outcome.   REHAB POTENTIAL: Good  CLINICAL DECISION MAKING: Stable/uncomplicated  EVALUATION COMPLEXITY: Low   GOALS: Goals reviewed with patient? Yes  SHORT TERM GOALS: Target date: 12/07/2021   Be independent in initial HEP Baseline: Goal status: MET (11/22/21)  2.  Demonstrate Rt shoulder A/ROM flexion to > or = to 80 degrees to improve self-care Baseline:  Goal status: MET  3.  Report > or = to 70% use of Rt UE with tasks below 90 degrees  Baseline: using >90 degrees, ~70%  Goal status: MET    LONG TERM GOALS: Target date: 02/07/22 Be independent in advanced HEP Baseline: advancement as needed now that restrictions are lifted (12/13/21) Goal status: In progress   2.  Improve FOTO to > or = to 65  Baseline: 64 (12/13/21) Goal status: In progress   3.  Demonstrate > or = to 135 degrees of Rt shoulder A/ROM flexion to improve ADLs  Baseline: 115 (12/13/21) Goal status: REVISED  4.  Demonstrate neutral posture to promote scapular healing and allow for return to restored Rt UE A/ROM Baseline: improved posture, but still working on this  (12/06/21) Goal status: in  progress   5.  Demonstrate Rt shoulder IR to > or = to T10 to improve self care Baseline: L2 Goal status: INITIAL   6. Demonstrate > or = to 4+/5 to 5/5 Rt shoulder strength to improve endurance and safety for lifting   Baseline: 3+ to 4+    Goal Status: INITIAL   PLAN: PT FREQUENCY: 1-2x/week  PT DURATION: 8 weeks  PLANNED INTERVENTIONS: Therapeutic exercises, Therapeutic activity, Neuromuscular re-education, Patient/Family education, Self Care, Joint mobilization, Aquatic Therapy, Dry Needling, Electrical stimulation, Cryotherapy, Moist heat, Taping, Ultrasound, Manual therapy, and Re-evaluation  PLAN FOR NEXT SESSION:  ROM progression, gentle strength and endurance as tolerated   Baruch Merl, PT 01/16/22 12:33 PM  [facility] [address], [place], [place] [address] Phone # [phone] Fax [phone]

## 2022-01-18 ENCOUNTER — Ambulatory Visit: Payer: Managed Care, Other (non HMO)

## 2022-01-18 DIAGNOSIS — R293 Abnormal posture: Secondary | ICD-10-CM

## 2022-01-18 DIAGNOSIS — G8929 Other chronic pain: Secondary | ICD-10-CM

## 2022-01-18 DIAGNOSIS — M25511 Pain in right shoulder: Secondary | ICD-10-CM | POA: Diagnosis not present

## 2022-01-18 DIAGNOSIS — M25611 Stiffness of right shoulder, not elsewhere classified: Secondary | ICD-10-CM

## 2022-01-18 NOTE — Therapy (Signed)
OUTPATIENT PHYSICAL THERAPY UPPER EXTREMITY TREATMENT NOTE   Patient Name: Keith Schroeder MRN: [id number] DOB:[birth year], 61 y.o., male Today's Date: 01/18/2022   PT End of Session - 01/18/22 1223     Visit Number 16    Date for PT Re-Evaluation 02/07/22    Authorization Type [employer]    PT Start Time 1148    PT Stop Time 1223    PT Time Calculation (min) 35 min    Activity Tolerance Patient tolerated treatment well    Behavior During Therapy WFL for tasks assessed/performed                        Past Medical History:  Diagnosis Date   Anxiety    GERD (gastroesophageal reflux disease)    Past Surgical History:  Procedure Laterality Date   CHOLECYSTECTOMY     Patient Active Problem List   Diagnosis Date Noted   TBI (traumatic brain injury) ([place]) 09/19/2021    PCP: Shirline Frees, MD   REFERRING PROVIDER: Esmond Plants, MD  REFERRING DIAG: [phone] (ICD-10-CM) - Displaced fracture of coracoid process, right shoulder, initial encounter for closed fracture  THERAPY DIAG:  Chronic right shoulder pain  Stiffness of right shoulder, not elsewhere classified  Abnormal posture  Rationale for Evaluation and Treatment Rehabilitation  ONSET DATE: 09/19/21  SUBJECTIVE:                                                                                                                                                                                      SUBJECTIVE STATEMENT: If I put my arm in a certain position I can find some pain.  I am way better than where I was.   Initial eval: Pt reports to PT with Rt scapular fracture sustained 09/19/21 when he fell through a floor of his attic.  Pt also sustained a skull fracture and L5 compression fracture. Pt had a 1 week stay in the hospital.  Pt sustained a contusion to frontal lobe.  Pt was in a sling after injury and now has instructions to not raise arm >90 degrees.    PERTINENT HISTORY: TBI, anxiety   PAIN:  Are  you having pain? Yes: NPRS scale: 0/10 up to 3/10 with use against gravity.  Pain location: Rt arm, diffuse around shoulder  Pain description: sore/stiffness  Aggravating factors: use, trying to lift arm Relieving factors: rest  PRECAUTIONS: Other: per MD: gentle therapy, below shoulder only (<90 degrees), yellow theraband ok.    WEIGHT BEARING RESTRICTIONS No  FALLS:  Has patient fallen in last 6 months? No No loss of balance- fell in attic when balancing on  truss  LIVING ENVIRONMENT: Lives with: lives with their spouse Lives in: House/apartment Stairs:  4 steps to enter; 14 steps to basement Has following equipment at home: Electronics engineer and Grab bars   OCCUPATION: Retired   PLOF: Independent  PATIENT GOALS return to normal use of Rt UE, improve ROM and strength of Rt UE  OBJECTIVE:  DIAGNOSTIC FINDINGS:  MRI:  Early healing changes involving the fracture through the base of the acromion. Some early areas of bony ingrowth but no solid osseous bridging or significant callus formation. 2. No other fractures are identified. 3. Grossly by CT the rotator cuff tendons are intact.  PATIENT SURVEYS:  FOTO 44 (goal is 77) 12/06/21: 64 (goal is 35)  COGNITION:  Overall cognitive status: Within functional limits for tasks assessed     SENSATION: WFL  POSTURE: Forward head and rounded shoulder   UPPER EXTREMITY ROM:   Active ROM Right eval Right  11/22/21 Right 12/06/21 Right  12/13/21 Right  01/12/22 Left eval  Shoulder flexion 65 (90 PROM) 88 A/ROM 90 with ease 115 120 Full throughout  Shoulder extension full       Shoulder abduction 80 (90 PROM)  90 with ease  105    Shoulder adduction        Shoulder internal rotation NT   L2 T10   Shoulder external rotation NT   T2    Elbow flexion full       Elbow extension        Wrist flexion        Wrist extension        Wrist ulnar deviation        Wrist radial deviation        Wrist pronation        Wrist supination         (Blank rows = not tested)  UPPER EXTREMITY MMT:  MMT Right eval Right  12/13/21 Left eval  Shoulder flexion Submax testing in neutral: 4+/5 4- 5/5 throughout  Shoulder extension Submax testing in neutral: 4+/5    Shoulder abduction  3+   Shoulder adduction     Shoulder internal rotation  4   Shoulder external rotation  4+   Middle trapezius     Lower trapezius     Elbow flexion 4+/5    Elbow extension     Wrist flexion     Wrist extension     Wrist ulnar deviation     Wrist radial deviation     Wrist pronation     Wrist supination     Grip strength (lbs)     (Blank rows = not tested)   PALPATION:  Palpable tenderness over Rt anterior glenohumeral joint.  Tension in Rt pecs, tender at axilla, anterior deltoid.  Limited joint mobility in all directions with significant guarding   TODAY'S TREATMENT:  Date: 01/18/22 NuStep L7 x 3' while waiting for UBE UBE seated L2.5 x4 min (2/2), L2.2 x4 min (2/2), PT present to discuss status Supine chest press with 5# dumbbell added 2x10 Sidelying ER with 3  lb 2x10 Sidelying abduction 2# 2x10 Rows with cable weights 15# 2x10 Wall push-ups 2x10 Standing shoulder flexion and scaption with 1 lb 1 x 10 each right - PT TC and manual facilitation of scapular depression with upward rotation for final range each rep  Date: 01/16/22 NuStep L7 x 3' while waiting for UBE UBE seated L2.5 x4 min (2/2), L2.2 x4 min (2/2), PT present to discuss status  Supine chest press with 5# dumbbell added 2x10 Sidelying ER with 3  lb 2x10 Sidelying abduction 2# 2x10 Rows with cable weights 15# 2x10 Wall push-ups 2x10 Standing shoulder flexion and scaption with 1 lb 1 x 10 each right - PT TC and manual facilitation of scapular depression with upward rotation for final range each rep   Date: 01/12/22 UBE seated: level 2.2 x 8 min (4/4)- PT present to discuss progress  Standing shoulder flexion and scaption with 1 lb 2 x 10 each right Wall push-ups  2x10 Rows with cable weights 15# 2x10 Side lying ER with 3  lb x 20 Supine chest press with 5# dumbbell added 2x10 Sidelying abduction 2# 2x10 (attempted 3# but had pain with eccentric motion)   PATIENT EDUCATION: Education details: Access Code: [email or web address] Person educated: Patient Education method: Explanation, Demonstration, and Handouts Education comprehension: verbalized understanding and returned demonstration   HOME EXERCISE PROGRAM: Access Code: [email or web address] URL: [email or web address] Date: 12/09/2021 Prepared by: Candyce Churn  Exercises - Supine Shoulder Press with Dowel  - 2 x daily - 7 x weekly - 2 sets - 10 reps - Supine Shoulder Flexion with Dowel  - 1 x daily - 7 x weekly - 2 sets - 10 reps - 5-10 hold - Shoulder Extension with Resistance  - 1 x daily - 7 x weekly - 2 sets - 10 reps - Seated Scapular Retraction  - 5 x daily - 7 x weekly - 1 sets - 10 reps - Seated Single Arm Bicep Curls Supinated with Dumbbell  - 1 x daily - 7 x weekly - 2 sets - 10 reps - Prone Shoulder Extension - Single Arm  - 2 x daily - 7 x weekly - 2 sets - 10 reps - Prone Shoulder Row  - 2 x daily - 7 x weekly - 2 sets - 10 reps - Prone Single Arm Shoulder Horizontal Abduction with Scapular Retraction and Palm Down  - 2 x daily - 7 x weekly - 2 sets - 10 reps - Sidelying Shoulder External Rotation  - 2 x daily - 7 x weekly - 2 sets - 10 reps - Single Arm Serratus Punches in Supine with Dumbbell  - 2 x daily - 7 x weekly - 2 sets - 10 reps - Standing Shoulder Flexion to 90 Degrees with Dumbbells  - 2 x daily - 7 x weekly - 2 sets - 10 reps - Standing Shoulder Scaption  - 2 x daily - 7 x weekly - 2 sets - 10 repsAccess Code: [email or web address]  ASSESSMENT:  CLINICAL IMPRESSION: Pt reports that he feels ok after PT and no increased pain after sessions.  Pt requires minor tactile cues for scapular depression with end range motion.  Pt has done well with advancement of exercise each week and  reports fewer limitations with functional use.  Pt is appropriately challenged by current level of exercise and demonstrates fatigue and challenge at end of each set.  Patient will benefit from skilled PT to address the below impairments and improve overall function.   OBJECTIVE IMPAIRMENTS decreased activity tolerance, decreased mobility, decreased ROM, decreased strength, increased muscle spasms, impaired flexibility, impaired UE functional use, postural dysfunction, and pain.   ACTIVITY LIMITATIONS carrying, lifting, bathing, dressing, and hygiene/grooming  PARTICIPATION LIMITATIONS: meal prep, cleaning, laundry, and yard work  PERSONAL FACTORS Time since onset of injury/illness/exacerbation and 1-2 comorbidities: Rt scapular fracture, vertigo/balance  are also affecting patient's functional outcome.   REHAB POTENTIAL: Good  CLINICAL DECISION  MAKING: Stable/uncomplicated  EVALUATION COMPLEXITY: Low   GOALS: Goals reviewed with patient? Yes  SHORT TERM GOALS: Target date: 12/07/2021   Be independent in initial HEP Baseline: Goal status: MET (11/22/21)  2.  Demonstrate Rt shoulder A/ROM flexion to > or = to 80 degrees to improve self-care Baseline:  Goal status: MET  3.  Report > or = to 70% use of Rt UE with tasks below 90 degrees  Baseline: using >90 degrees, ~70%  Goal status: MET    LONG TERM GOALS: Target date: 02/07/22 Be independent in advanced HEP Baseline: advancement as needed now that restrictions are lifted (12/13/21) Goal status: In progress   2.  Improve FOTO to > or = to 65  Baseline: 64 (12/13/21) Goal status: In progress   3.  Demonstrate > or = to 135 degrees of Rt shoulder A/ROM flexion to improve ADLs  Baseline: 115 (12/13/21) Goal status: REVISED  4.  Demonstrate neutral posture to promote scapular healing and allow for return to restored Rt UE A/ROM Baseline: improved posture, but still working on this  (12/06/21) Goal status: in progress   5.   Demonstrate Rt shoulder IR to > or = to T10 to improve self care Baseline: T10 (01/18/22) Goal status: MET    6. Demonstrate > or = to 4+/5 to 5/5 Rt shoulder strength to improve endurance and safety for lifting   Baseline: 3+ to 4+    Goal Status: INITIAL   PLAN: PT FREQUENCY: 1-2x/week  PT DURATION: 8 weeks  PLANNED INTERVENTIONS: Therapeutic exercises, Therapeutic activity, Neuromuscular re-education, Patient/Family education, Self Care, Joint mobilization, Aquatic Therapy, Dry Needling, Electrical stimulation, Cryotherapy, Moist heat, Taping, Ultrasound, Manual therapy, and Re-evaluation  PLAN FOR NEXT SESSION:  ROM progression, gentle strength and endurance as tolerated   Sigurd Sos, PT 01/18/22 12:24 PM   [facility] [address] [place], [place] [address] Phone # [phone] Fax [phone]

## 2022-01-23 ENCOUNTER — Ambulatory Visit: Payer: Managed Care, Other (non HMO)

## 2022-01-23 DIAGNOSIS — R293 Abnormal posture: Secondary | ICD-10-CM

## 2022-01-23 DIAGNOSIS — G8929 Other chronic pain: Secondary | ICD-10-CM

## 2022-01-23 DIAGNOSIS — M25611 Stiffness of right shoulder, not elsewhere classified: Secondary | ICD-10-CM

## 2022-01-23 DIAGNOSIS — M25511 Pain in right shoulder: Secondary | ICD-10-CM | POA: Diagnosis not present

## 2022-01-23 DIAGNOSIS — M6281 Muscle weakness (generalized): Secondary | ICD-10-CM

## 2022-01-23 NOTE — Therapy (Signed)
OUTPATIENT PHYSICAL THERAPY UPPER EXTREMITY TREATMENT NOTE   Patient Name: Keith Schroeder MRN: [id number] DOB:12-14-1960, 61 y.o., male Today's Date: 01/23/2022   PT End of Session - 01/23/22 1227     Visit Number 17    Date for PT Re-Evaluation 02/07/22    Authorization Type [employer]    PT Start Time 1152   late   PT Stop Time 1227    PT Time Calculation (min) 35 min    Activity Tolerance Patient tolerated treatment well    Behavior During Therapy WFL for tasks assessed/performed                         Past Medical History:  Diagnosis Date   Anxiety    GERD (gastroesophageal reflux disease)    Past Surgical History:  Procedure Laterality Date   CHOLECYSTECTOMY     Patient Active Problem List   Diagnosis Date Noted   TBI (traumatic brain injury) ([place]) 09/19/2021    PCP: Shirline Frees, MD   REFERRING PROVIDER: Esmond Plants, MD  REFERRING DIAG: [phone] (ICD-10-CM) - Displaced fracture of coracoid process, right shoulder, initial encounter for closed fracture  THERAPY DIAG:  Chronic right shoulder pain  Abnormal posture  Stiffness of right shoulder, not elsewhere classified  Muscle weakness (generalized)  Rationale for Evaluation and Treatment Rehabilitation  ONSET DATE: 09/19/21  SUBJECTIVE:                                                                                                                                                                                      SUBJECTIVE STATEMENT: I have come a long way.  I have 3-5/10 Rt shoulder pain with "certain activities" and this is frustrating.    Initial eval: Pt reports to PT with Rt scapular fracture sustained 09/19/21 when he fell through a floor of his attic.  Pt also sustained a skull fracture and L5 compression fracture. Pt had a 1 week stay in the hospital.  Pt sustained a contusion to frontal lobe.  Pt was in a sling after injury and now has instructions to not raise arm >90 degrees.     PERTINENT HISTORY: TBI, anxiety   PAIN:  Are you having pain? Yes: NPRS scale: 0/10.  3-5/10 max with reaching behind back.   Pain location: Rt arm, diffuse around shoulder  Pain description: sore/stiffness  Aggravating factors: use, trying to lift arm Relieving factors: rest  PRECAUTIONS: Other: per MD: gentle therapy, below shoulder only (<90 degrees), yellow theraband ok.    WEIGHT BEARING RESTRICTIONS No  FALLS:  Has patient fallen in last 6 months? No No loss of balance-  fell in attic when balancing on truss  LIVING ENVIRONMENT: Lives with: lives with their spouse Lives in: House/apartment Stairs:  4 steps to enter; 14 steps to basement Has following equipment at home: Electronics engineer and Grab bars   OCCUPATION: Retired   PLOF: Independent  PATIENT GOALS return to normal use of Rt UE, improve ROM and strength of Rt UE  OBJECTIVE:  DIAGNOSTIC FINDINGS:  MRI:  Early healing changes involving the fracture through the base of the acromion. Some early areas of bony ingrowth but no solid osseous bridging or significant callus formation. 2. No other fractures are identified. 3. Grossly by CT the rotator cuff tendons are intact.  PATIENT SURVEYS:  FOTO 44 (goal is 18) 12/06/21: 64 (goal is 52)  COGNITION:  Overall cognitive status: Within functional limits for tasks assessed     SENSATION: WFL  POSTURE: Forward head and rounded shoulder   UPPER EXTREMITY ROM:   Active ROM Right eval Right  11/22/21 Right 12/06/21 Right  12/13/21 Right  01/12/22 Left eval  Shoulder flexion 65 (90 PROM) 88 A/ROM 90 with ease 115 120 Full throughout  Shoulder extension full       Shoulder abduction 80 (90 PROM)  90 with ease  105    Shoulder adduction        Shoulder internal rotation NT   L2 T10   Shoulder external rotation NT   T2    Elbow flexion full       Elbow extension        Wrist flexion        Wrist extension        Wrist ulnar deviation        Wrist radial  deviation        Wrist pronation        Wrist supination        (Blank rows = not tested)  UPPER EXTREMITY MMT:  MMT Right eval Right  12/13/21 Left eval  Shoulder flexion Submax testing in neutral: 4+/5 4- 5/5 throughout  Shoulder extension Submax testing in neutral: 4+/5    Shoulder abduction  3+   Shoulder adduction     Shoulder internal rotation  4   Shoulder external rotation  4+   Middle trapezius     Lower trapezius     Elbow flexion 4+/5    Elbow extension     Wrist flexion     Wrist extension     Wrist ulnar deviation     Wrist radial deviation     Wrist pronation     Wrist supination     Grip strength (lbs)     (Blank rows = not tested)   PALPATION:  Palpable tenderness over Rt anterior glenohumeral joint.  Tension in Rt pecs, tender at axilla, anterior deltoid.  Limited joint mobility in all directions with significant guarding   TODAY'S TREATMENT:  Date: 01/20/22 UBE seated L2.2x 8 minutes (4/4)-PT present to discuss progress with patient.  Seated ER with blue loop around wrist: 2x10 Sidelying abduction 2# 2x10 Rows with cable weights 15# 2x10 Lat pull down: 40# 2x10 Counter top push-ups 2x10- challenging.   Band loop around wrists: 3 way Rt and Lt in scapular plane x10 each  Standing shoulder flexion and scaption with 1.5 lb 1 x 10 each right - PT TC and manual facilitation of scapular depression with upward rotation for final range each rep Seated flexion, scaption, abduction:  2x5 bil each on Rt  Date: 01/18/22  UBE seated L2.5 x4 min (2/2), L2.2 x4 min (2/2), PT present to discuss status Supine chest press with 5# dumbbell added 2x10 Sidelying ER with 3  lb 2x10 Sidelying abduction 2# 2x10 Rows with cable weights 15# 2x10 Wall push-ups 2x10 Standing shoulder flexion and scaption with 1 lb 1 x 10 each right - PT TC and manual facilitation of scapular depression with upward rotation for final range each rep  Date: 01/16/22 NuStep L7 x 3' while  waiting for UBE UBE seated L2.5 x4 min (2/2), L2.2 x4 min (2/2), PT present to discuss status Supine chest press with 5# dumbbell added 2x10 Sidelying ER with 3  lb 2x10 Sidelying abduction 2# 2x10 Rows with cable weights 15# 2x10 Wall push-ups 2x10 Standing shoulder flexion and scaption with 1 lb 1 x 10 each right - PT TC and manual facilitation of scapular depression with upward rotation for final range each rep   PATIENT EDUCATION: Education details: Access Code: [email or web address] Person educated: Patient Education method: Explanation, Demonstration, and Handouts Education comprehension: verbalized understanding and returned demonstration   HOME EXERCISE PROGRAM: Access Code: [email or web address] URL: [email or web address] Date: 12/09/2021 Prepared by: Candyce Churn  Exercises - Supine Shoulder Press with Dowel  - 2 x daily - 7 x weekly - 2 sets - 10 reps - Supine Shoulder Flexion with Dowel  - 1 x daily - 7 x weekly - 2 sets - 10 reps - 5-10 hold - Shoulder Extension with Resistance  - 1 x daily - 7 x weekly - 2 sets - 10 reps - Seated Scapular Retraction  - 5 x daily - 7 x weekly - 1 sets - 10 reps - Seated Single Arm Bicep Curls Supinated with Dumbbell  - 1 x daily - 7 x weekly - 2 sets - 10 reps - Prone Shoulder Extension - Single Arm  - 2 x daily - 7 x weekly - 2 sets - 10 reps - Prone Shoulder Row  - 2 x daily - 7 x weekly - 2 sets - 10 reps - Prone Single Arm Shoulder Horizontal Abduction with Scapular Retraction and Palm Down  - 2 x daily - 7 x weekly - 2 sets - 10 reps - Sidelying Shoulder External Rotation  - 2 x daily - 7 x weekly - 2 sets - 10 reps - Single Arm Serratus Punches in Supine with Dumbbell  - 2 x daily - 7 x weekly - 2 sets - 10 reps - Standing Shoulder Flexion to 90 Degrees with Dumbbells  - 2 x daily - 7 x weekly - 2 sets - 10 reps - Standing Shoulder Scaption  - 2 x daily - 7 x weekly - 2 sets - 10 repsAccess Code: [email or web address]  ASSESSMENT:  CLINICAL  IMPRESSION: Pt reports that he feels ok after PT and no increased pain after sessions.  Pt reports 3-5/10 pain with reaching behind his back or lifting heavy objects. Pt requires minor tactile cues for scapular depression with end range motion with exercises in the clinic.  Pt has done well with advancement of exercise each week and reports fewer limitations with functional use.  Pt is appropriately challenged by current level of exercise and demonstrates fatigue and challenge at end of each set.  Pt did well with advancement to 1.5# on the Rt with scaption and flexion.  Patient will benefit from skilled PT to address the below impairments and improve overall function.   OBJECTIVE IMPAIRMENTS decreased activity tolerance, decreased mobility, decreased ROM, decreased strength,  increased muscle spasms, impaired flexibility, impaired UE functional use, postural dysfunction, and pain.   ACTIVITY LIMITATIONS carrying, lifting, bathing, dressing, and hygiene/grooming  PARTICIPATION LIMITATIONS: meal prep, cleaning, laundry, and yard work  PERSONAL FACTORS Time since onset of injury/illness/exacerbation and 1-2 comorbidities: Rt scapular fracture, vertigo/balance  are also affecting patient's functional outcome.   REHAB POTENTIAL: Good  CLINICAL DECISION MAKING: Stable/uncomplicated  EVALUATION COMPLEXITY: Low   GOALS: Goals reviewed with patient? Yes  SHORT TERM GOALS: Target date: 12/07/2021   Be independent in initial HEP Baseline: Goal status: MET (11/22/21)  2.  Demonstrate Rt shoulder A/ROM flexion to > or = to 80 degrees to improve self-care Baseline:  Goal status: MET  3.  Report > or = to 70% use of Rt UE with tasks below 90 degrees  Baseline: using >90 degrees, ~70%  Goal status: MET    LONG TERM GOALS: Target date: 02/07/22 Be independent in advanced HEP Baseline: advancement as needed now that restrictions are lifted (12/13/21) Goal status: In progress   2.  Improve FOTO to  > or = to 65  Baseline: 64 (12/13/21) Goal status: In progress   3.  Demonstrate > or = to 135 degrees of Rt shoulder A/ROM flexion to improve ADLs  Baseline: 115 (12/13/21) Goal status: REVISED  4.  Demonstrate neutral posture to promote scapular healing and allow for return to restored Rt UE A/ROM Baseline: improved posture, but still working on this  (12/06/21) Goal status: in progress   5.  Demonstrate Rt shoulder IR to > or = to T10 to improve self care Baseline: T10 (01/18/22) Goal status: MET    6. Demonstrate > or = to 4+/5 to 5/5 Rt shoulder strength to improve endurance and safety for lifting   Baseline: 3+ to 4+    Goal Status: INITIAL   PLAN: PT FREQUENCY: 1-2x/week  PT DURATION: 8 weeks  PLANNED INTERVENTIONS: Therapeutic exercises, Therapeutic activity, Neuromuscular re-education, Patient/Family education, Self Care, Joint mobilization, Aquatic Therapy, Dry Needling, Electrical stimulation, Cryotherapy, Moist heat, Taping, Ultrasound, Manual therapy, and Re-evaluation  PLAN FOR NEXT SESSION:  ROM progression, gentle strength and endurance as tolerated   Sigurd Sos, PT 01/23/22 12:28 PM   [facility] [address], [place], [place] [address] Phone # [phone] Fax [phone]

## 2022-01-25 ENCOUNTER — Ambulatory Visit: Payer: Managed Care, Other (non HMO)

## 2022-01-25 DIAGNOSIS — M6281 Muscle weakness (generalized): Secondary | ICD-10-CM

## 2022-01-25 DIAGNOSIS — G8929 Other chronic pain: Secondary | ICD-10-CM

## 2022-01-25 DIAGNOSIS — M25511 Pain in right shoulder: Secondary | ICD-10-CM | POA: Diagnosis not present

## 2022-01-25 DIAGNOSIS — M25611 Stiffness of right shoulder, not elsewhere classified: Secondary | ICD-10-CM

## 2022-01-25 DIAGNOSIS — R293 Abnormal posture: Secondary | ICD-10-CM

## 2022-01-25 NOTE — Therapy (Signed)
OUTPATIENT PHYSICAL THERAPY UPPER EXTREMITY TREATMENT NOTE   Patient Name: Keith Schroeder MRN: [id number] DOB:September 19, 1960, 61 y.o., male Today's Date: 01/25/2022   PT End of Session - 01/25/22 1226     Visit Number 18    Date for PT Re-Evaluation 02/07/22    Authorization Type [employer]    PT Start Time 1148    PT Stop Time 1226    PT Time Calculation (min) 38 min    Activity Tolerance Patient tolerated treatment well    Behavior During Therapy WFL for tasks assessed/performed                          Past Medical History:  Diagnosis Date   Anxiety    GERD (gastroesophageal reflux disease)    Past Surgical History:  Procedure Laterality Date   CHOLECYSTECTOMY     Patient Active Problem List   Diagnosis Date Noted   TBI (traumatic brain injury) ([place]) 09/19/2021    PCP: Shirline Frees, MD   REFERRING PROVIDER: Esmond Plants, MD  REFERRING DIAG: [phone] (ICD-10-CM) - Displaced fracture of coracoid process, right shoulder, initial encounter for closed fracture  THERAPY DIAG:  Chronic right shoulder pain  Abnormal posture  Stiffness of right shoulder, not elsewhere classified  Muscle weakness (generalized)  Rationale for Evaluation and Treatment Rehabilitation  ONSET DATE: 09/19/21  SUBJECTIVE:                                                                                                                                                                                      SUBJECTIVE STATEMENT: I have come a long way.  I have 3-5/10 Rt shoulder pain with "certain activities" and this is frustrating.    Initial eval: Pt reports to PT with Rt scapular fracture sustained 09/19/21 when he fell through a floor of his attic.  Pt also sustained a skull fracture and L5 compression fracture. Pt had a 1 week stay in the hospital.  Pt sustained a contusion to frontal lobe.  Pt was in a sling after injury and now has instructions to not raise arm >90 degrees.     PERTINENT HISTORY: TBI, anxiety   PAIN:  Are you having pain? Yes: NPRS scale: 0/10.  3-5/10 max with reaching behind back.   Pain location: Rt arm, diffuse around shoulder  Pain description: sore/stiffness  Aggravating factors: use, trying to lift arm Relieving factors: rest  PRECAUTIONS: Other: per MD: gentle therapy, below shoulder only (<90 degrees), yellow theraband ok.    WEIGHT BEARING RESTRICTIONS No  FALLS:  Has patient fallen in last 6 months? No No loss of balance- fell  in attic when balancing on truss  LIVING ENVIRONMENT: Lives with: lives with their spouse Lives in: House/apartment Stairs:  4 steps to enter; 14 steps to basement Has following equipment at home: Electronics engineer and Grab bars   OCCUPATION: Retired   PLOF: Independent  PATIENT GOALS return to normal use of Rt UE, improve ROM and strength of Rt UE  OBJECTIVE:  DIAGNOSTIC FINDINGS:  MRI:  Early healing changes involving the fracture through the base of the acromion. Some early areas of bony ingrowth but no solid osseous bridging or significant callus formation. 2. No other fractures are identified. 3. Grossly by CT the rotator cuff tendons are intact.  PATIENT SURVEYS:  FOTO 44 (goal is 15) 12/06/21: 64 (goal is 39)  COGNITION:  Overall cognitive status: Within functional limits for tasks assessed     SENSATION: WFL  POSTURE: Forward head and rounded shoulder   UPPER EXTREMITY ROM:   Active ROM Right eval Right  11/22/21 Right 12/06/21 Right  12/13/21 Right  01/12/22 Left eval  Shoulder flexion 65 (90 PROM) 88 A/ROM 90 with ease 115 120 Full throughout  Shoulder extension full       Shoulder abduction 80 (90 PROM)  90 with ease  105    Shoulder adduction        Shoulder internal rotation NT   L2 T10   Shoulder external rotation NT   T2    Elbow flexion full       Elbow extension        Wrist flexion        Wrist extension        Wrist ulnar deviation        Wrist radial  deviation        Wrist pronation        Wrist supination        (Blank rows = not tested)  UPPER EXTREMITY MMT:  MMT Right eval Right  12/13/21 Left eval  Shoulder flexion Submax testing in neutral: 4+/5 4- 5/5 throughout  Shoulder extension Submax testing in neutral: 4+/5    Shoulder abduction  3+   Shoulder adduction     Shoulder internal rotation  4   Shoulder external rotation  4+   Middle trapezius     Lower trapezius     Elbow flexion 4+/5    Elbow extension     Wrist flexion     Wrist extension     Wrist ulnar deviation     Wrist radial deviation     Wrist pronation     Wrist supination     Grip strength (lbs)     (Blank rows = not tested)   PALPATION:  Palpable tenderness over Rt anterior glenohumeral joint.  Tension in Rt pecs, tender at axilla, anterior deltoid.  Limited joint mobility in all directions with significant guarding   TODAY'S TREATMENT:  Date: 01/25/22 UBE seated L2.2x 8 minutes (4/4)-PT present to discuss progress with patient.  Seated ER with blue loop around wrist: 2x10 Supine chest press: 6# 2x10- challenging at end of each set.  Rows with cable weights 15# 2x10 Lat pull down: 40# 2x10 Counter top push-ups 2x10- challenging.   Band loop around wrists: 3 way Rt and Lt in scapular plane x10 each  Standing shoulder flexion and scaption with 1.5 lb 1 x 10 each right - PT TC and manual facilitation of scapular depression with upward rotation for final range each rep Seated flexion, scaption, abduction:  2x5  bil each on Rt  Date: 01/23/22 UBE seated L2.2x 8 minutes (4/4)-PT present to discuss progress with patient.  Seated ER with blue loop around wrist: 2x10 Sidelying abduction 2# 2x10 Rows with cable weights 15# 2x10 Lat pull down: 40# 2x10 Counter top push-ups 2x10- challenging.   Band loop around wrists: 3 way Rt and Lt in scapular plane x10 each  Standing shoulder flexion and scaption with 1.5 lb 1 x 10 each right - PT TC and manual  facilitation of scapular depression with upward rotation for final range each rep Seated flexion, scaption, abduction:  2x5 bil each on Rt  Date: 01/18/22 UBE seated L2.5 x4 min (2/2), L2.2 x4 min (2/2), PT present to discuss status Supine chest press with 5# dumbbell added 2x10 Sidelying ER with 3  lb 2x10 Sidelying abduction 2# 2x10 Rows with cable weights 15# 2x10 Wall push-ups 2x10 Standing shoulder flexion and scaption with 1 lb 1 x 10 each right - PT TC and manual facilitation of scapular depression with upward rotation for final range each rep    PATIENT EDUCATION: Education details: Access Code: [email or web address] Person educated: Patient Education method: Explanation, Demonstration, and Handouts Education comprehension: verbalized understanding and returned demonstration   HOME EXERCISE PROGRAM: Access Code: [email or web address] URL: [email or web address] Date: 12/09/2021 Prepared by: Candyce Churn  Exercises - Supine Shoulder Press with Dowel  - 2 x daily - 7 x weekly - 2 sets - 10 reps - Supine Shoulder Flexion with Dowel  - 1 x daily - 7 x weekly - 2 sets - 10 reps - 5-10 hold - Shoulder Extension with Resistance  - 1 x daily - 7 x weekly - 2 sets - 10 reps - Seated Scapular Retraction  - 5 x daily - 7 x weekly - 1 sets - 10 reps - Seated Single Arm Bicep Curls Supinated with Dumbbell  - 1 x daily - 7 x weekly - 2 sets - 10 reps - Prone Shoulder Extension - Single Arm  - 2 x daily - 7 x weekly - 2 sets - 10 reps - Prone Shoulder Row  - 2 x daily - 7 x weekly - 2 sets - 10 reps - Prone Single Arm Shoulder Horizontal Abduction with Scapular Retraction and Palm Down  - 2 x daily - 7 x weekly - 2 sets - 10 reps - Sidelying Shoulder External Rotation  - 2 x daily - 7 x weekly - 2 sets - 10 reps - Single Arm Serratus Punches in Supine with Dumbbell  - 2 x daily - 7 x weekly - 2 sets - 10 reps - Standing Shoulder Flexion to 90 Degrees with Dumbbells  - 2 x daily - 7 x weekly - 2  sets - 10 reps - Standing Shoulder Scaption  - 2 x daily - 7 x weekly - 2 sets - 10 repsAccess Code: [email or web address]  ASSESSMENT:  CLINICAL IMPRESSION: Pt reports that he had some increased muscle soreness after session last visit but this was not bad.  Pt reports 3-5/10 pain with reaching behind his back or lifting heavy objects at home. Pt demonstrated improved scapular activation with countertop push-ups.  He had fatigue on 2nd set. Pt is appropriately challenged by current level of exercise and demonstrates fatigue and challenge at end of each set.  Pt did well with advancement to 1.5# on the Rt with scaption and flexion this week and 6# with supine chest press.  Patient will benefit from skilled PT to address the below  impairments and improve overall function.   OBJECTIVE IMPAIRMENTS decreased activity tolerance, decreased mobility, decreased ROM, decreased strength, increased muscle spasms, impaired flexibility, impaired UE functional use, postural dysfunction, and pain.   ACTIVITY LIMITATIONS carrying, lifting, bathing, dressing, and hygiene/grooming  PARTICIPATION LIMITATIONS: meal prep, cleaning, laundry, and yard work  PERSONAL FACTORS Time since onset of injury/illness/exacerbation and 1-2 comorbidities: Rt scapular fracture, vertigo/balance  are also affecting patient's functional outcome.   REHAB POTENTIAL: Good  CLINICAL DECISION MAKING: Stable/uncomplicated  EVALUATION COMPLEXITY: Low   GOALS: Goals reviewed with patient? Yes  SHORT TERM GOALS: Target date: 12/07/2021   Be independent in initial HEP Baseline: Goal status: MET (11/22/21)  2.  Demonstrate Rt shoulder A/ROM flexion to > or = to 80 degrees to improve self-care Baseline:  Goal status: MET  3.  Report > or = to 70% use of Rt UE with tasks below 90 degrees  Baseline: using >90 degrees, ~70%  Goal status: MET    LONG TERM GOALS: Target date: 02/07/22 Be independent in advanced HEP Baseline: advancement as  needed now that restrictions are lifted (12/13/21) Goal status: In progress   2.  Improve FOTO to > or = to 65  Baseline: 64 (12/13/21) Goal status: In progress   3.  Demonstrate > or = to 135 degrees of Rt shoulder A/ROM flexion to improve ADLs  Baseline: 115 (12/13/21) Goal status: REVISED  4.  Demonstrate neutral posture to promote scapular healing and allow for return to restored Rt UE A/ROM Baseline: improved posture, but still working on this  (12/06/21) Goal status: in progress   5.  Demonstrate Rt shoulder IR to > or = to T10 to improve self care Baseline: T10 (01/18/22) Goal status: MET    6. Demonstrate > or = to 4+/5 to 5/5 Rt shoulder strength to improve endurance and safety for lifting   Baseline: 3+ to 4+    Goal Status: INITIAL   PLAN: PT FREQUENCY: 1-2x/week  PT DURATION: 8 weeks  PLANNED INTERVENTIONS: Therapeutic exercises, Therapeutic activity, Neuromuscular re-education, Patient/Family education, Self Care, Joint mobilization, Aquatic Therapy, Dry Needling, Electrical stimulation, Cryotherapy, Moist heat, Taping, Ultrasound, Manual therapy, and Re-evaluation  PLAN FOR NEXT SESSION:  ROM progression, gentle strength and endurance as tolerated.  Pt has MRI on 02/01/22.  2 more sessions probable with D/C to HEP.   Sigurd Sos, PT 01/25/22 12:27 PM   [facility] [address] [place], [place] [address] Phone # [phone] Fax [phone]

## 2022-02-01 ENCOUNTER — Ambulatory Visit: Payer: Managed Care, Other (non HMO)

## 2022-02-03 ENCOUNTER — Ambulatory Visit: Payer: Managed Care, Other (non HMO)

## 2022-02-03 DIAGNOSIS — R293 Abnormal posture: Secondary | ICD-10-CM

## 2022-02-03 DIAGNOSIS — M25511 Pain in right shoulder: Secondary | ICD-10-CM | POA: Diagnosis not present

## 2022-02-03 DIAGNOSIS — M25611 Stiffness of right shoulder, not elsewhere classified: Secondary | ICD-10-CM

## 2022-02-03 DIAGNOSIS — G8929 Other chronic pain: Secondary | ICD-10-CM

## 2022-02-03 DIAGNOSIS — M6281 Muscle weakness (generalized): Secondary | ICD-10-CM

## 2022-02-03 NOTE — Therapy (Signed)
OUTPATIENT PHYSICAL THERAPY UPPER EXTREMITY TREATMENT NOTE   Patient Name: Keith Schroeder MRN: [id number] DOB:06/28/1960, 61 y.o., male Today's Date: 02/03/2022   PT End of Session - 02/03/22 0846     Visit Number 19    Date for PT Re-Evaluation 02/07/22    Authorization Type [employer]    PT Start Time 0846    PT Stop Time 0936    PT Time Calculation (min) 50 min    Equipment Utilized During Treatment Back brace;Other (comment)    Activity Tolerance Patient tolerated treatment well    Behavior During Therapy WFL for tasks assessed/performed              Past Medical History:  Diagnosis Date   Anxiety    GERD (gastroesophageal reflux disease)    Past Surgical History:  Procedure Laterality Date   CHOLECYSTECTOMY     Patient Active Problem List   Diagnosis Date Noted   TBI (traumatic brain injury) ([place]) 09/19/2021    PCP: Shirline Frees, MD   REFERRING PROVIDER: Esmond Plants, MD  REFERRING DIAG: [phone] (ICD-10-CM) - Displaced fracture of coracoid process, right shoulder, initial encounter for closed fracture  THERAPY DIAG:  Chronic right shoulder pain  Abnormal posture  Stiffness of right shoulder, not elsewhere classified  Muscle weakness (generalized)  Rationale for Evaluation and Treatment Rehabilitation  ONSET DATE: 09/19/21  SUBJECTIVE:                                                                                                                                                                                      SUBJECTIVE STATEMENT: Patient states he is doing ok.  He has most trouble reaching back and lifting heavier objects overhead such as laundry detergent bottle on shelf over washer.  Initial eval: Pt reports to PT with Rt scapular fracture sustained 09/19/21 when he fell through a floor of his attic.  Pt also sustained a skull fracture and L5 compression fracture. Pt had a 1 week stay in the hospital.  Pt sustained a contusion to frontal  lobe.  Pt was in a sling after injury and now has instructions to not raise arm >90 degrees.    PERTINENT HISTORY: TBI, anxiety   PAIN:  Are you having pain? Yes: NPRS scale: 0/10.  3-5/10 max with reaching behind back.   Pain location: Rt arm, diffuse around shoulder  Pain description: sore/stiffness  Aggravating factors: use, trying to lift arm Relieving factors: rest  PRECAUTIONS: Other: per MD: gentle therapy, below shoulder only (<90 degrees), yellow theraband ok.    WEIGHT BEARING RESTRICTIONS No  FALLS:  Has patient fallen in last 6 months? No No loss  of balance- fell in attic when balancing on truss  LIVING ENVIRONMENT: Lives with: lives with their spouse Lives in: House/apartment Stairs:  4 steps to enter; 14 steps to basement Has following equipment at home: Electronics engineer and Grab bars   OCCUPATION: Retired   PLOF: Independent  PATIENT GOALS return to normal use of Rt UE, improve ROM and strength of Rt UE  OBJECTIVE:  DIAGNOSTIC FINDINGS:  MRI:  Early healing changes involving the fracture through the base of the acromion. Some early areas of bony ingrowth but no solid osseous bridging or significant callus formation. 2. No other fractures are identified. 3. Grossly by CT the rotator cuff tendons are intact.  PATIENT SURVEYS:  FOTO 44 (goal is 73) 12/06/21: 64 (goal is 53)  COGNITION:  Overall cognitive status: Within functional limits for tasks assessed     SENSATION: WFL  POSTURE: Forward head and rounded shoulder   UPPER EXTREMITY ROM:   Active ROM Right eval Right  11/22/21 Right 12/06/21 Right  12/13/21 Right  01/12/22 Left eval  Shoulder flexion 65 (90 PROM) 88 A/ROM 90 with ease 115 120 Full throughout  Shoulder extension full       Shoulder abduction 80 (90 PROM)  90 with ease  105    Shoulder adduction        Shoulder internal rotation NT   L2 T10   Shoulder external rotation NT   T2    Elbow flexion full       Elbow extension         Wrist flexion        Wrist extension        Wrist ulnar deviation        Wrist radial deviation        Wrist pronation        Wrist supination        (Blank rows = not tested)  UPPER EXTREMITY MMT:  MMT Right eval Right  12/13/21 Left eval  Shoulder flexion Submax testing in neutral: 4+/5 4- 5/5 throughout  Shoulder extension Submax testing in neutral: 4+/5    Shoulder abduction  3+   Shoulder adduction     Shoulder internal rotation  4   Shoulder external rotation  4+   Middle trapezius     Lower trapezius     Elbow flexion 4+/5    Elbow extension     Wrist flexion     Wrist extension     Wrist ulnar deviation     Wrist radial deviation     Wrist pronation     Wrist supination     Grip strength (lbs)     (Blank rows = not tested)   PALPATION:  Palpable tenderness over Rt anterior glenohumeral joint.  Tension in Rt pecs, tender at axilla, anterior deltoid.  Limited joint mobility in all directions with significant guarding   TODAY'S TREATMENT:  Date: 02/03/22 UBE seated L2.2x 8 minutes (4/4)-PT present to discuss progress with patient.  Seated ER with blue loop around wrist: 2x10 Supine chest press: 6# 2x10- challenging at end of each set.  Seated flexion, scaption, abduction:  2 x 10 bil each on Rt Standing shoulder flexion and scaption with 1.5 lb 2 x 10 each right - PT   VC and manual facilitation of scapular depression with upward rotation for final range each rep Rows with cable weights 15# 2x10 Lat pull down: 40# 2x10 Band loop around wrists: 3 way Rt and Lt in scapular  plane x10 each  Counter top push-ups 2x10- challenging.   Ice pack x 10 min to right shoulder post treatment  Date: 01/25/22 UBE seated L2.2x 8 minutes (4/4)-PT present to discuss progress with patient.  Seated ER with blue loop around wrist: 2x10 Supine chest press: 6# 2x10- challenging at end of each set.  Rows with cable weights 15# 2x10 Lat pull down: 40# 2x10 Counter top push-ups  2x10- challenging.   Band loop around wrists: 3 way Rt and Lt in scapular plane x10 each  Standing shoulder flexion and scaption with 1.5 lb 1 x 10 each right - PT TC and manual facilitation of scapular depression with upward rotation for final range each rep Seated flexion, scaption, abduction:  2x5 bil each on Rt  Date: 01/23/22 UBE seated L2.2x 8 minutes (4/4)-PT present to discuss progress with patient.  Seated ER with blue loop around wrist: 2x10 Sidelying abduction 2# 2x10 Rows with cable weights 15# 2x10 Lat pull down: 40# 2x10 Counter top push-ups 2x10- challenging.   Band loop around wrists: 3 way Rt and Lt in scapular plane x10 each  Standing shoulder flexion and scaption with 1.5 lb 1 x 10 each right - PT TC and manual facilitation of scapular depression with upward rotation for final range each rep Seated flexion, scaption, abduction:  2x5 bil each on Rt   PATIENT EDUCATION: Education details: Access Code: [email or web address] Person educated: Patient Education method: Explanation, Demonstration, and Handouts Education comprehension: verbalized understanding and returned demonstration   HOME EXERCISE PROGRAM: Access Code: [email or web address] URL: [email or web address] Date: 12/09/2021 Prepared by: Candyce Churn  Exercises - Supine Shoulder Press with Dowel  - 2 x daily - 7 x weekly - 2 sets - 10 reps - Supine Shoulder Flexion with Dowel  - 1 x daily - 7 x weekly - 2 sets - 10 reps - 5-10 hold - Shoulder Extension with Resistance  - 1 x daily - 7 x weekly - 2 sets - 10 reps - Seated Scapular Retraction  - 5 x daily - 7 x weekly - 1 sets - 10 reps - Seated Single Arm Bicep Curls Supinated with Dumbbell  - 1 x daily - 7 x weekly - 2 sets - 10 reps - Prone Shoulder Extension - Single Arm  - 2 x daily - 7 x weekly - 2 sets - 10 reps - Prone Shoulder Row  - 2 x daily - 7 x weekly - 2 sets - 10 reps - Prone Single Arm Shoulder Horizontal Abduction with Scapular Retraction and Palm  Down  - 2 x daily - 7 x weekly - 2 sets - 10 reps - Sidelying Shoulder External Rotation  - 2 x daily - 7 x weekly - 2 sets - 10 reps - Single Arm Serratus Punches in Supine with Dumbbell  - 2 x daily - 7 x weekly - 2 sets - 10 reps - Standing Shoulder Flexion to 90 Degrees with Dumbbells  - 2 x daily - 7 x weekly - 2 sets - 10 reps - Standing Shoulder Scaption  - 2 x daily - 7 x weekly - 2 sets - 10 repsAccess Code: [email or web address]  ASSESSMENT:  CLINICAL IMPRESSION: Harlee is progressing but continues to have impingement type pain.  He is able to do overhead reaching but, again, has pain with heavier objects.  He is well motivated and compliant.  He had MRI on 02/01/22 and will meet with MD to go over results on 02/08/22.   Patient will  benefit from skilled PT to address the below impairments and improve overall function.   OBJECTIVE IMPAIRMENTS decreased activity tolerance, decreased mobility, decreased ROM, decreased strength, increased muscle spasms, impaired flexibility, impaired UE functional use, postural dysfunction, and pain.   ACTIVITY LIMITATIONS carrying, lifting, bathing, dressing, and hygiene/grooming  PARTICIPATION LIMITATIONS: meal prep, cleaning, laundry, and yard work  PERSONAL FACTORS Time since onset of injury/illness/exacerbation and 1-2 comorbidities: Rt scapular fracture, vertigo/balance  are also affecting patient's functional outcome.   REHAB POTENTIAL: Good  CLINICAL DECISION MAKING: Stable/uncomplicated  EVALUATION COMPLEXITY: Low   GOALS: Goals reviewed with patient? Yes  SHORT TERM GOALS: Target date: 12/07/2021   Be independent in initial HEP Baseline: Goal status: MET (11/22/21)  2.  Demonstrate Rt shoulder A/ROM flexion to > or = to 80 degrees to improve self-care Baseline:  Goal status: MET  3.  Report > or = to 70% use of Rt UE with tasks below 90 degrees  Baseline: using >90 degrees, ~70%  Goal status: MET    LONG TERM GOALS: Target date: 02/07/22 Be  independent in advanced HEP Baseline: advancement as needed now that restrictions are lifted (12/13/21) Goal status: In progress   2.  Improve FOTO to > or = to 65  Baseline: 64 (12/13/21) Goal status: In progress   3.  Demonstrate > or = to 135 degrees of Rt shoulder A/ROM flexion to improve ADLs  Baseline: 115 (12/13/21) Goal status: REVISED  4.  Demonstrate neutral posture to promote scapular healing and allow for return to restored Rt UE A/ROM Baseline: improved posture, but still working on this  (12/06/21) Goal status: in progress   5.  Demonstrate Rt shoulder IR to > or = to T10 to improve self care Baseline: T10 (01/18/22) Goal status: MET    6. Demonstrate > or = to 4+/5 to 5/5 Rt shoulder strength to improve endurance and safety for lifting   Baseline: 3+ to 4+    Goal Status: INITIAL   PLAN: PT FREQUENCY: 1-2x/week  PT DURATION: 8 weeks  PLANNED INTERVENTIONS: Therapeutic exercises, Therapeutic activity, Neuromuscular re-education, Patient/Family education, Self Care, Joint mobilization, Aquatic Therapy, Dry Needling, Electrical stimulation, Cryotherapy, Moist heat, Taping, Ultrasound, Manual therapy, and Re-evaluation  PLAN FOR NEXT SESSION:  ROM progression, gentle strength and endurance as tolerated.  Pt has MRI on 02/01/22.  2 more sessions probable with D/C to HEP.   Anderson Malta B. Jamela Cumbo, PT 02/03/22 9:41 AM   [facility] [address], [place], [place] [address] Phone # [phone] Fax [phone]

## 2022-02-15 ENCOUNTER — Ambulatory Visit: Payer: Managed Care, Other (non HMO) | Attending: Orthopedic Surgery

## 2022-02-15 DIAGNOSIS — M25511 Pain in right shoulder: Secondary | ICD-10-CM | POA: Diagnosis present

## 2022-02-15 DIAGNOSIS — M6281 Muscle weakness (generalized): Secondary | ICD-10-CM | POA: Insufficient documentation

## 2022-02-15 DIAGNOSIS — R293 Abnormal posture: Secondary | ICD-10-CM

## 2022-02-15 DIAGNOSIS — G8929 Other chronic pain: Secondary | ICD-10-CM | POA: Diagnosis present

## 2022-02-15 DIAGNOSIS — M25611 Stiffness of right shoulder, not elsewhere classified: Secondary | ICD-10-CM | POA: Insufficient documentation

## 2022-02-15 NOTE — Therapy (Signed)
OUTPATIENT PHYSICAL THERAPY UPPER EXTREMITY RECERT NOTE  PHYSICAL THERAPY DISCHARGE SUMMARY  Visits from Start of Care: 20  Current functional level related to goals / functional outcomes: See below   Remaining deficits: See below   Education / Equipment: See below   Patient agrees to discharge. Patient goals were met. Patient is being discharged due to meeting the stated rehab goals.   Patient Name: Keith Schroeder MRN: [id number] DOB:18-Jul-1960, 62 y.o., male Today's Date: 02/15/2022   PT End of Session - 02/15/22 0852     Visit Number 20    Date for PT Re-Evaluation 03/07/22    Authorization Type [employer]    PT Start Time 0847    PT Stop Time 0925    PT Time Calculation (min) 38 min    Equipment Utilized During Treatment Back brace;Other (comment)    Activity Tolerance Patient tolerated treatment well    Behavior During Therapy WFL for tasks assessed/performed              Past Medical History:  Diagnosis Date   Anxiety    GERD (gastroesophageal reflux disease)    Past Surgical History:  Procedure Laterality Date   CHOLECYSTECTOMY     Patient Active Problem List   Diagnosis Date Noted   TBI (traumatic brain injury) ([place]) 09/19/2021    PCP: Shirline Frees, MD   REFERRING PROVIDER: Esmond Plants, MD  REFERRING DIAG: [phone] (ICD-10-CM) - Displaced fracture of coracoid process, right shoulder, initial encounter for closed fracture  THERAPY DIAG:  Chronic right shoulder pain - Plan: PT plan of care cert/re-cert  Abnormal posture - Plan: PT plan of care cert/re-cert  Stiffness of right shoulder, not elsewhere classified - Plan: PT plan of care cert/re-cert  Muscle weakness (generalized) - Plan: PT plan of care cert/re-cert  Rationale for Evaluation and Treatment Rehabilitation  ONSET DATE: 09/19/21  SUBJECTIVE:                                                                                                                                                                                       SUBJECTIVE STATEMENT: Patient states saw MD for f/u.  MD did new scans and feels the fracture area is healing well and he is ok to "graduate" from his care.  He also felt the patient could continue therapy at home once PT and he felt he was ready to discharge.  Patient feels he is ready to work on his own for a while.    Initial eval: Pt reports to PT with Rt scapular fracture sustained 09/19/21 when he fell through a floor of his attic.  Pt also sustained a skull fracture  and L5 compression fracture. Pt had a 1 week stay in the hospital.  Pt sustained a contusion to frontal lobe.  Pt was in a sling after injury and now has instructions to not raise arm >90 degrees.    PERTINENT HISTORY: TBI, anxiety   PAIN:  Are you having pain? Yes: NPRS scale: 0/10.  3-5/10 max with reaching behind back.   Pain location: Rt arm, diffuse around shoulder  Pain description: sore/stiffness  Aggravating factors: use, trying to lift arm Relieving factors: rest  PRECAUTIONS: Other: per MD: gentle therapy, below shoulder only (<90 degrees), yellow theraband ok.    WEIGHT BEARING RESTRICTIONS No  FALLS:  Has patient fallen in last 6 months? No No loss of balance- fell in attic when balancing on truss  LIVING ENVIRONMENT: Lives with: lives with their spouse Lives in: House/apartment Stairs:  4 steps to enter; 14 steps to basement Has following equipment at home: Electronics engineer and Grab bars   OCCUPATION: Retired   PLOF: Independent  PATIENT GOALS return to normal use of Rt UE, improve ROM and strength of Rt UE  OBJECTIVE:  DIAGNOSTIC FINDINGS:  MRI:  Early healing changes involving the fracture through the base of the acromion. Some early areas of bony ingrowth but no solid osseous bridging or significant callus formation. 2. No other fractures are identified. 3. Grossly by CT the rotator cuff tendons are intact.  PATIENT SURVEYS:  FOTO 44 (goal is  28) 12/06/21: 64 (goal is 65) 02/15/22:  70 (exceeding predicted outcome)  COGNITION:  Overall cognitive status: Within functional limits for tasks assessed     SENSATION: WFL  POSTURE: Forward head and rounded shoulder   UPPER EXTREMITY ROM:   Active ROM Right eval Right  11/22/21 Right 12/06/21 Right  12/13/21 Right  01/12/22 Left eval Right 02/15/22  Shoulder flexion 65 (90 PROM) 88 A/ROM 90 with ease 115 120 Full throughout 147  Shoulder extension full        Shoulder abduction 80 (90 PROM)  90 with ease  105   121  Shoulder adduction         Shoulder internal rotation NT   L2 T10  T10  Shoulder external rotation NT   T2   63/T5  Elbow flexion full        Elbow extension         Wrist flexion         Wrist extension         Wrist ulnar deviation         Wrist radial deviation         Wrist pronation         Wrist supination         (Blank rows = not tested)  UPPER EXTREMITY MMT:  MMT Right eval Right  12/13/21 Left eval Right 02/15/22  Shoulder flexion Submax testing in neutral: 4+/5 4- 5/5 throughout 5  Shoulder extension Submax testing in neutral: 4+/5   5  Shoulder abduction  3+  4  Shoulder adduction      Shoulder internal rotation  4  5  Shoulder external rotation  4+  4+  Middle trapezius      Lower trapezius      Elbow flexion 4+/5   5  Elbow extension    5  Wrist flexion      Wrist extension      Wrist ulnar deviation      Wrist radial deviation  Wrist pronation      Wrist supination      Grip strength (lbs)      (Blank rows = not tested)   PALPATION:  Palpable tenderness over Rt anterior glenohumeral joint.  Tension in Rt pecs, tender at axilla, anterior deltoid.  Limited joint mobility in all directions with significant guarding 02/15/22: Patient no longer tender over these areas   TODAY'S TREATMENT:  Date: 02/15/22 UBE seated L2.2x 8 minutes (4/4)-PT present to discuss progress with patient.  Recert assessment completed: see  above DC plan and how to progress completed  Date: 02/03/22 UBE seated L2.2x 8 minutes (4/4)-PT present to discuss progress with patient.  Seated ER with blue loop around wrist: 2x10 Supine chest press: 6# 2x10- challenging at end of each set.  Seated flexion, scaption, abduction:  2 x 10 bil each on Rt Standing shoulder flexion and scaption with 1.5 lb 2 x 10 each right - PT   VC and manual facilitation of scapular depression with upward rotation for final range each rep Rows with cable weights 15# 2x10 Lat pull down: 40# 2x10 Band loop around wrists: 3 way Rt and Lt in scapular plane x10 each  Counter top push-ups 2x10- challenging.   Ice pack x 10 min to right shoulder post treatment  Date: 01/25/22 UBE seated L2.2x 8 minutes (4/4)-PT present to discuss progress with patient.  Seated ER with blue loop around wrist: 2x10 Supine chest press: 6# 2x10- challenging at end of each set.  Rows with cable weights 15# 2x10 Lat pull down: 40# 2x10 Counter top push-ups 2x10- challenging.   Band loop around wrists: 3 way Rt and Lt in scapular plane x10 each  Standing shoulder flexion and scaption with 1.5 lb 1 x 10 each right - PT TC and manual facilitation of scapular depression with upward rotation for final range each rep Seated flexion, scaption, abduction:  2x5 bil each on Rt  PATIENT EDUCATION: Education details: Access Code: [email or web address] Person educated: Patient Education method: Explanation, Demonstration, and Handouts Education comprehension: verbalized understanding and returned demonstration   HOME EXERCISE PROGRAM: Access Code: [email or web address] URL: [email or web address] Date: 12/09/2021 Prepared by: Candyce Churn  Exercises - Supine Shoulder Press with Dowel  - 2 x daily - 7 x weekly - 2 sets - 10 reps - Supine Shoulder Flexion with Dowel  - 1 x daily - 7 x weekly - 2 sets - 10 reps - 5-10 hold - Shoulder Extension with Resistance  - 1 x daily - 7 x weekly - 2 sets -  10 reps - Seated Scapular Retraction  - 5 x daily - 7 x weekly - 1 sets - 10 reps - Seated Single Arm Bicep Curls Supinated with Dumbbell  - 1 x daily - 7 x weekly - 2 sets - 10 reps - Prone Shoulder Extension - Single Arm  - 2 x daily - 7 x weekly - 2 sets - 10 reps - Prone Shoulder Row  - 2 x daily - 7 x weekly - 2 sets - 10 reps - Prone Single Arm Shoulder Horizontal Abduction with Scapular Retraction and Palm Down  - 2 x daily - 7 x weekly - 2 sets - 10 reps - Sidelying Shoulder External Rotation  - 2 x daily - 7 x weekly - 2 sets - 10 reps - Single Arm Serratus Punches in Supine with Dumbbell  - 2 x daily - 7 x weekly - 2 sets - 10 reps - Standing Shoulder Flexion to  90 Degrees with Dumbbells  - 2 x daily - 7 x weekly - 2 sets - 10 reps - Standing Shoulder Scaption  - 2 x daily - 7 x weekly - 2 sets - 10 repsAccess Code: [email or web address]  ASSESSMENT:  CLINICAL IMPRESSION: Ludie had f/u with MD.  MD felt he was doing well and could "graduate" from his care and left the therapy decision up to him and PT.  We did re-assessment and patient seems to have resumed approx 90% of full function.  He has exceeded his FOTO score. ROM and strength are WFL.  He is well motivated and compliant with his HEP.  He should continue to do well and progress to full function.  We will DC at this time with patient in agreement with DC.    OBJECTIVE IMPAIRMENTS decreased activity tolerance, decreased mobility, decreased ROM, decreased strength, increased muscle spasms, impaired flexibility, impaired UE functional use, postural dysfunction, and pain.   ACTIVITY LIMITATIONS carrying, lifting, bathing, dressing, and hygiene/grooming  PARTICIPATION LIMITATIONS: meal prep, cleaning, laundry, and yard work  PERSONAL FACTORS Time since onset of injury/illness/exacerbation and 1-2 comorbidities: Rt scapular fracture, vertigo/balance  are also affecting patient's functional outcome.   REHAB POTENTIAL: Good  CLINICAL DECISION  MAKING: Stable/uncomplicated  EVALUATION COMPLEXITY: Low   GOALS: Goals reviewed with patient? Yes  SHORT TERM GOALS: Target date: 12/07/2021   Be independent in initial HEP Baseline: Goal status: MET (11/22/21)  2.  Demonstrate Rt shoulder A/ROM flexion to > or = to 80 degrees to improve self-care Baseline:  Goal status: MET  3.  Report > or = to 70% use of Rt UE with tasks below 90 degrees  Baseline: using >90 degrees, ~70%  Goal status: MET    LONG TERM GOALS: Target date: 02/07/22 Be independent in advanced HEP Baseline: advancement as needed now that restrictions are lifted (12/13/21) Goal status: MET   2.  Improve FOTO to > or = to 65  Baseline: 64 (12/13/21) Goal status: MET  3.  Demonstrate > or = to 135 degrees of Rt shoulder A/ROM flexion to improve ADLs  Baseline: 115 (12/13/21) Goal status: MET  4.  Demonstrate neutral posture to promote scapular healing and allow for return to restored Rt UE A/ROM Baseline: improved posture, but still working on this  (12/06/21) Goal status: MET  5.  Demonstrate Rt shoulder IR to > or = to T10 to improve self care Baseline: T10 (01/18/22) Goal status: MET    6. Demonstrate > or = to 4+/5 to 5/5 Rt shoulder strength to improve endurance and safety for lifting   Baseline: 3+ to 4+    Goal Status: MET   PLAN: PT FREQUENCY: 1-2x/week  PT DURATION: 8 weeks  PLANNED INTERVENTIONS: Therapeutic exercises, Therapeutic activity, Neuromuscular re-education, Patient/Family education, Self Care, Joint mobilization, Aquatic Therapy, Dry Needling, Electrical stimulation, Cryotherapy, Moist heat, Taping, Ultrasound, Manual therapy, and Re-evaluation  PLAN FOR NEXT SESSION:  We will DC at this time.   Victorino Dike B. Kasandra Fehr, PT 02/15/22 9:33 AM   [facility] [address] [place], [place] [address] Phone # [phone] Fax [phone]

## 2022-02-17 ENCOUNTER — Ambulatory Visit: Payer: Managed Care, Other (non HMO)

## 2023-04-03 DIAGNOSIS — Z1211 Encounter for screening for malignant neoplasm of colon: Secondary | ICD-10-CM | POA: Diagnosis not present

## 2023-04-16 DIAGNOSIS — J209 Acute bronchitis, unspecified: Secondary | ICD-10-CM | POA: Diagnosis not present

## 2023-04-16 DIAGNOSIS — R062 Wheezing: Secondary | ICD-10-CM | POA: Diagnosis not present

## 2023-04-16 DIAGNOSIS — R059 Cough, unspecified: Secondary | ICD-10-CM | POA: Diagnosis not present

## 2023-04-16 DIAGNOSIS — J329 Chronic sinusitis, unspecified: Secondary | ICD-10-CM | POA: Diagnosis not present
